# Patient Record
Sex: Female | Born: 1985 | Race: Black or African American | Hispanic: No | Marital: Single | Smoking: Current every day smoker
Health system: Southern US, Community
[De-identification: ages and names within clinical notes are randomized; demographics above are authoritative.]

## PROBLEM LIST (undated history)

## (undated) DIAGNOSIS — K089 Disorder of teeth and supporting structures, unspecified: Secondary | ICD-10-CM

---
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
## 2004-01-03 ENCOUNTER — Other Ambulatory Visit: Admission: RE | Admit: 2004-01-03 | Discharge: 2004-01-03 | Payer: Self-pay | Admitting: Obstetrics and Gynecology

## 2004-06-15 ENCOUNTER — Ambulatory Visit: Payer: Self-pay | Admitting: Internal Medicine

## 2004-06-21 ENCOUNTER — Ambulatory Visit: Payer: Self-pay | Admitting: *Deleted

## 2005-03-16 ENCOUNTER — Inpatient Hospital Stay (HOSPITAL_COMMUNITY): Admission: AD | Admit: 2005-03-16 | Discharge: 2005-03-16 | Payer: Self-pay | Admitting: Obstetrics and Gynecology

## 2005-07-24 ENCOUNTER — Ambulatory Visit: Payer: Self-pay | Admitting: Family Medicine

## 2006-09-18 ENCOUNTER — Ambulatory Visit: Payer: Self-pay | Admitting: Obstetrics and Gynecology

## 2006-09-18 ENCOUNTER — Inpatient Hospital Stay (HOSPITAL_COMMUNITY): Admission: AD | Admit: 2006-09-18 | Discharge: 2006-09-22 | Payer: Self-pay | Admitting: Obstetrics and Gynecology

## 2007-06-14 ENCOUNTER — Emergency Department (HOSPITAL_COMMUNITY): Admission: EM | Admit: 2007-06-14 | Discharge: 2007-06-14 | Payer: Self-pay | Admitting: Family Medicine

## 2007-08-12 ENCOUNTER — Emergency Department (HOSPITAL_COMMUNITY): Admission: EM | Admit: 2007-08-12 | Discharge: 2007-08-13 | Payer: Self-pay | Admitting: Emergency Medicine

## 2008-01-23 ENCOUNTER — Inpatient Hospital Stay (HOSPITAL_COMMUNITY): Admission: AD | Admit: 2008-01-23 | Discharge: 2008-01-23 | Payer: Self-pay | Admitting: Obstetrics & Gynecology

## 2008-03-24 ENCOUNTER — Ambulatory Visit (HOSPITAL_COMMUNITY): Admission: RE | Admit: 2008-03-24 | Discharge: 2008-03-24 | Payer: Self-pay | Admitting: Obstetrics & Gynecology

## 2008-07-31 ENCOUNTER — Inpatient Hospital Stay (HOSPITAL_COMMUNITY): Admission: AD | Admit: 2008-07-31 | Discharge: 2008-08-04 | Payer: Self-pay | Admitting: Obstetrics & Gynecology

## 2008-08-01 ENCOUNTER — Encounter: Payer: Self-pay | Admitting: Obstetrics & Gynecology

## 2009-11-10 ENCOUNTER — Emergency Department (HOSPITAL_COMMUNITY): Admission: EM | Admit: 2009-11-10 | Discharge: 2009-11-10 | Payer: Self-pay | Admitting: Family Medicine

## 2010-05-26 ENCOUNTER — Emergency Department (HOSPITAL_COMMUNITY)
Admission: EM | Admit: 2010-05-26 | Discharge: 2010-05-26 | Disposition: A | Discharge status: Home / Self Care | Payer: No Typology Code available for payment source | Attending: Emergency Medicine | Admitting: Emergency Medicine

## 2010-05-26 ENCOUNTER — Emergency Department (HOSPITAL_COMMUNITY): Payer: No Typology Code available for payment source

## 2010-05-26 DIAGNOSIS — R10811 Right upper quadrant abdominal tenderness: Secondary | ICD-10-CM | POA: Insufficient documentation

## 2010-05-26 DIAGNOSIS — R1011 Right upper quadrant pain: Secondary | ICD-10-CM | POA: Insufficient documentation

## 2010-05-26 DIAGNOSIS — S139XXA Sprain of joints and ligaments of unspecified parts of neck, initial encounter: Secondary | ICD-10-CM | POA: Insufficient documentation

## 2010-05-26 DIAGNOSIS — M25519 Pain in unspecified shoulder: Secondary | ICD-10-CM | POA: Insufficient documentation

## 2010-05-26 DIAGNOSIS — R079 Chest pain, unspecified: Secondary | ICD-10-CM | POA: Insufficient documentation

## 2010-05-26 DIAGNOSIS — M549 Dorsalgia, unspecified: Secondary | ICD-10-CM | POA: Insufficient documentation

## 2010-05-26 DIAGNOSIS — Y929 Unspecified place or not applicable: Secondary | ICD-10-CM | POA: Insufficient documentation

## 2010-05-26 DIAGNOSIS — M542 Cervicalgia: Secondary | ICD-10-CM | POA: Insufficient documentation

## 2010-05-26 LAB — URINALYSIS, ROUTINE W REFLEX MICROSCOPIC
Ketones, ur: NEGATIVE mg/dL
Nitrite: NEGATIVE
Protein, ur: NEGATIVE mg/dL
Urobilinogen, UA: 0.2 mg/dL (ref 0.0–1.0)

## 2010-05-26 LAB — POCT PREGNANCY, URINE: Preg Test, Ur: NEGATIVE

## 2010-05-26 MED ORDER — IOHEXOL 300 MG/ML  SOLN
100.0000 mL | Freq: Once | INTRAMUSCULAR | Status: AC | PRN
  Administered 2010-05-26: 100 mL via INTRAVENOUS

## 2010-07-19 LAB — CBC
Platelets: 437 10*3/uL — ABNORMAL HIGH (ref 150–400)
RBC: 3.37 MIL/uL — ABNORMAL LOW (ref 3.87–5.11)
RDW: 14.7 % (ref 11.5–15.5)
WBC: 15.6 10*3/uL — ABNORMAL HIGH (ref 4.0–10.5)

## 2010-08-22 NOTE — Op Note (Signed)
NAMEAVERIE, HORNBAKER                 ACCOUNT NO.:  [id number]   MEDICAL RECORD NO.:  [id number]          PATIENT TYPE:  INP   LOCATION:  9171                          FACILITY:  [facility]   PHYSICIAN:  Roseanna Rainbow, M.D.DATE OF BIRTH:  [birth year]   DATE OF PROCEDURE:  08/01/2008  DATE OF DISCHARGE:                               OPERATIVE REPORT   PREOPERATIVE DIAGNOSES:  1. Intrauterine pregnancy at term.  2. History of a previous cesarean delivery for trial of labor.  3. Protracted latent phase of labor.   POSTOPERATIVE DIAGNOSES:  1. Intrauterine pregnancy at term.  2. History of a previous cesarean delivery for trial of labor.  3. Protracted latent phase of labor.   PROCEDURE:  Repeat cesarean delivery, lysis of adhesions.   SURGEON:  Roseanna Rainbow, MD   ANESTHESIA:  Epidural.   FINDINGS:  Adhesions involving the bladder flap, anterior cul-de-sac.  Meconium stained fluid.   PATHOLOGY:  Placenta.   ESTIMATED BLOOD LOSS:  700 mL.   COMPLICATIONS:  None.   DESCRIPTION OF PROCEDURE:  The patient was taken to the operating room  with an IV running and an epidural catheter in situ.  A Foley catheter  was then placed as well.  The patient was placed in the dorsal supine  position with a leftward tilt and prepped and draped in the usual  sterile fashion.  After a time-out had been completed, the previous  transverse scar was incised with the scalpel.  This incision was carried  down to the fascia.  The fascia was incised along the length of the  incision.  The superior aspect of the fascial incision was tented up and  the underlying rectus muscles was dissected off.  The inferior aspect of  the fascial incision was manipulated in a similar fashion.  There were  omental adhesions to the parietal peritoneum of the anterior abdominal  wall.  There were dense adhesions involving the parietal peritoneum of  the anterior abdominal wall to the peritoneum of the bladder  flap.  These adhesions were divided using both sharp and blunt dissection.  The  bladder blade was then placed.  The lower uterine segment was then  incised in a transverse fashion using the scalpel.  The incision was  then extended bluntly.  The infant's head was delivered atraumatically.  The oropharynx was suctioned with a bulb suction.  The cord was clamped  and cut.  The infant was handed off to the awaiting neonatologist.  An  umbilical artery pH was sent.  The placenta was then removed.  The  intrauterine cavity was evacuated of any remaining amniotic fluid clots  and debris with moistened laparotomy sponge.  The uterine incision was  then reapproximated in a running interlocking fashion using a suture of  0 Monocryl.  A second imbricating layer of the same suture was placed.  Adequate hemostasis was noted.  The paracolic gutters were then  irrigated.  Seprafilm was placed over the uterine incision.  The  parietal peritoneum was then reapproximated in a running fashion using 2-  0 Vicryl.  The Seprafilm was placed under the upper aspect of the  closure.  The fascia was then closed with 2 running sutures of 0 PDS  tied in the midline.  The skin was closed in a subcuticular fashion  using a suture of 3-0 Monocryl.  At the close of the procedure, the  instrument and pack counts were said to be correct x2.  The patient was  taken to the PACU awake and in stable condition.      Roseanna Rainbow, M.D.  Electronically Signed     LAJ/MEDQ  D:  08/01/2008  T:  08/01/2008  Job:  [id number]

## 2010-08-22 NOTE — Discharge Summary (Signed)
[name], [name]                 ACCOUNT NO.:  [id number]   MEDICAL RECORD NO.:  [id number]          PATIENT TYPE:  INP   LOCATION:  9114                          [facility]ILITY:  [facility]   PHYSICIAN:  Charles A. Clearance Coots, M.D.DATE OF BIRTH:  08/25/1985   DATE OF ADMISSION:  07/31/2008  DATE OF DISCHARGE:  08/04/2008                               DISCHARGE SUMMARY   ADMITTING DIAGNOSES:  Term pregnancy, previous cesarean section, early  labor, desires trial of labor.   DISCHARGE DIAGNOSES:  Term pregnancy, previous cesarean section, early  labor, desires trial of labor.  Status post repeat low transverse  cesarean section and lysis of adhesions on August 01, 2008.  A viable  female delivered at 6:21, Apgars 8 at 1 minute and 9 at 5 minutes, weight  of 3625 g, length of 53.34 cm.  Mother and infant discharged home in  good condition.   REASON FOR ADMISSION:  A 25 year old para 1, estimated date of  confinement of July 28, 2008, presents at 40+ weeks' gestation with  uterine contractions and complaint of rupture of membranes.   PAST MEDICAL HISTORY:   SURGERY:  Cesarean section.   ILLNESSES:  None.   MEDICATIONS:  Prenatal vitamins.   ALLERGIES:  PENICILLIN and IBUPROFEN.   SOCIAL HISTORY:  Single.  Positive tobacco, positive alcohol.  Negative  recreational drug use.   FAMILY HISTORY:  No major illnesses known.   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   PHYSICAL EXAMINATION:  GENERAL:  A well-nourished, well-developed female  in no acute distress.  Afebrile.  VITAL SIGNS:  Stable.  LUNGS:  Clear to auscultation bilaterally.  HEART:  Regular rate and rhythm.  ABDOMEN:  Gravid, nontender.  PELVIC:  Sterile vaginal exam was done per registered nurse and  confirmed gross rupture of membranes.  Cervix was 2 cm dilated, 60%  effaced and vertex at a -2 station.   ADMITTING LABORATORY FINDINGS:   Hemoglobin 10, hematocrit 31, white  blood cell count 12,600, and platelets 437,000.  RPR is nonreactive.   HOSPITAL COURSE:  The patient was admitted and progressed to 5-cm  dilatation, no further progress.  Decision was made to proceed with  cesarean section delivery for protracted latent phase of labor.  Repeat  low transverse cesarean section and lysis of adhesions were performed on  August 01, 2008.  There were no intraoperative complications.  Postoperative course was uncomplicated.  The patient was discharged home  on postop day #3 in good condition.   DISCHARGE LABORATORY FINDINGS:  Hemoglobin 9.5, hematocrit 28, white  blood cell count of 15,600, and platelets 374,000.   DISCHARGE DISPOSITION:   MEDICATIONS:  Percocet was prescribed for pain.  Continue prenatal  vitamins.   Routine written instructions were given for discharge after cesarean  section.  The patient is to call office for followup appointment in 2  weeks.       Charles A. Clearance Coots, M.D.  Electronically Signed     CAH/MEDQ  D:  08/04/2008  T:  08/04/2008  Job:  [id number]

## 2010-08-22 NOTE — H&P (Signed)
[name], [name]                 ACCOUNT NO.:  [id number]   MEDICAL RECORD NO.:  [id number]          PATIENT TYPE:  INP   LOCATION:  9171                          [facility]ILITY[facility]  [facility]   PHYSICIAN:  Roseanna Rainbow, M.D.DATE OF BIRTH:  1986-04-01   DATE OF ADMISSION:  07/31/2008  DATE OF DISCHARGE:                              HISTORY & PHYSICAL   CHIEF COMPLAINT:  The patient is a 25 year old para 1 with an estimated  date of confinement of April 21 with an intrauterine pregnancy at 40+  weeks and a history of previous cesarean delivery for a trial of labor  complaining of rupture of membranes and mild uterine contractions.   HISTORY OF PRESENT ILLNESS:  Please see the above.  She reports clear  fluid.   ALLERGIES:  PENICILLIN and IBUPROFEN.   MEDICATIONS:  Prenatal vitamins.   OB RISK FACTORS:  Previous cesarean delivery, GBS asymptomatic  bacteriuria.   PRENATAL LABS:  Blood type is O positive.  Antibody screen negative.  Chlamydia probe negative.  Urine culture and sensitivity in February 15, 2008, insignificant growth.  September 2009, GBS 65,000 colonies/mL.  Quad screen negative.  Pap smear negative.  GC probe negative.  One-hour  GCT 94.  CF testing was negative for mutations.  Platelets 322,000.  RPR  nonreactive.  Rubella immune.  Sickle cell negative.   PAST OBSTETRICAL HISTORY:  In June 2008, she was delivered of a live  born female, 6 pounds 10 ounces, full term via cesarean delivery.   PAST GYN HISTORY:  Noncontributory.   PAST MEDICAL HISTORY:  No significant history of medical diseases.   PAST SURGICAL HISTORY:  Please see the above, oral surgery.   SOCIAL HISTORY:  She is a homemaker, single, not currently using  alcohol, formally a moderate user.  Previously smoked one-half pack per  day.  Denies illicit drug use.   FAMILY HISTORY:  No major illnesses known.   REVIEW OF SYSTEMS:  GU:  Please see the above.   PHYSICAL EXAMINATION:  Vital signs are  stable.  Afebrile.  Fetal heart  tracing reassuring.  Tocodynamometer irregular uterine contractions.  Sterile vaginal exam per the RN, grossly ruptured.   ASSESSMENT:  Primipara at term, history of a previous cesarean delivery,  desires a trial of labor, history of a positive group B streptococcus.   PLAN:  Admission, GBS prophylaxis, expectant management for now possible  augmentation of labor with low-dose Pitocin if needed.      Roseanna Rainbow, M.D.  Electronically Signed     LAJ/MEDQ  D:  07/31/2008  T:  07/31/2008  Job:  [id number]

## 2010-08-22 NOTE — Op Note (Signed)
[name], [name]                 ACCOUNT NO.:  [id number]   MEDICAL RECORD NO.:  [id number]          PATIENT TYPE:  INP   LOCATION:  9144                          [facility]ACILITY:  [facility]   PHYSICIAN:  Allie Bossier, MD        DATE OF BIRTH:  04/21/1985   DATE OF PROCEDURE:  09/19/2006  DATE OF DISCHARGE:                               OPERATIVE REPORT   PREOPERATIVE DIAGNOSIS:  1. Term intrauterine pregnancy.  2. Premature rupture of membranes.  3. Failure to progress.   POSTOPERATIVE DIAGNOSIS:  1. Term intrauterine pregnancy.  2. Premature rupture of membranes.  3. Failure to progress.  4. Presentation occipitoposterior.   SURGERY:  Primary low transverse cesarean section via Pfannenstiel skin  incision.   SURGEON:  Nicholaus Bloom, M.D.   ASSISTANT:  Carolanne Grumbling, M.D.   ANESTHESIA:  Epidural and local with Marcaine   ESTIMATED BLOOD LOSS:  600 mL   IV FLUIDS:  1500 mL lactated Ringer's.  Urine output clear.   SPECIMENS:  Placenta to labor and delivery.  Cord blood to lab.   INDICATIONS:  25 year old G1 at term presented with PROM, progressed to  6 cm was adequate based on MVU for 6 hours, then failed to progress.  The patient was counseled on risks and benefits and wanted to proceed  with cesarean section.   FINDINGS:  Viable female in the occiput posterior presentation, Apgars  08/09, weight 6 pounds 10 ounces and normal uterus, tubes and ovaries.   PROCEDURE:  The patient was taken to the operating room where epidural  anesthesia was found to be adequate.  She was prepped in the usual  sterile fashion in dorsal supine position with a leftward tilt.  Pfannenstiel skin incision was made and carried to the fascia which was  nicked in midline and extended laterally with Mayo scissors.  Superior  aspect of the fascial incision was grasped Kocher clamps, tented up and  dissected off the rectus muscles using the Mayo scissors.  An inferior  aspect of the fascial incision was  grasped in a similar fashion with  Kocher clamps, tented up and dissected off and the rectus muscles were  dissected off.  Rectus muscles were separated in the midline, abdominal  cavity was entered.  Bladder blade was inserted.  Uterus was incised in  a low transverse fashion.  Baby was noted to be in occiput posterior  presentation and head was delivered atraumatically followed by the body.  Cord was clamped and cut.  Infant was handed to waiting neonatologist.  Placenta was delivered manually.  Cord blood was sent to lab.  The  uterus was cleared of all clots and debris.  Incision was closed with 0  Vicryl in a running fashion running locked fashion.  Excellent  hemostasis was obtained.  Fascia was closed with 0 Vicryl in a running  locked fashion.  Skin was closed with staples.   The patient tolerated procedure well.  There were no complications.  The  patient was taken to the recovery room in stable condition.  Sponge, lap  and  needle counts were correct x2.     ______________________________  Marc Morgans. Mayford Knife, M.D.      Allie Bossier, MD  Electronically Signed    TLW/MEDQ  D:  09/19/2006  T:  09/20/2006  Job:  [id number]

## 2010-08-22 NOTE — Discharge Summary (Signed)
NAMEDEMETRIC, [name]                 ACCOUNT NO.:  [id number]   MEDICAL RECORD NO.:  [id number]          PATIENT TYPE:  INP   LOCATION:  9144                          [facility]   PHYSICIAN:  Tracy L. Mayford Knife, M.D.DATE OF BIRTH:  08/09/1985   DATE OF ADMISSION:  09/18/2006  DATE OF DISCHARGE:  09/22/2006                               DISCHARGE SUMMARY   ADMISSION DIAGNOSES:  1. 38-week 4-day intrauterine pregnancy with premature rupture of      membranes.  2. Group B strep-positive.   DISCHARGE DIAGNOSES:  1. 38 weeks 4 days intrauterine pregnancy, delivered via primary      cesarean section for premature rupture of membranes and failure to      progress.  2. Group B strep treated with Ancef.  3. Anemia secondary to acute blood loss.   LABORATORY DATA:  Hemoglobin 12.6, discharge hemoglobin 10.6.   HOSPITAL COURSE:  The patient is a 25 year old G1, P0 at 91 and 4 weeks  dated by a last menstrual period of December 20, 2005 that gives her an  [facility] of September 21, 2006.  The patient presented with spontaneous rupture of  membranes at 1945 on September 18, 2006.  The patient was initially closed,  thick, and high.  She was group B strep-positive.  She was admitted, and  Pitocin augmentation was started.  She did have an IUPC placed during  her hospitalization.  Adequate labor was documented for 6 hours.  The  patient never progressed past 6 cm.  Decision was made to proceed with  primary cesarean section.  The patient was counseled on the risks and  benefits and agreed.  Please see operative note for full details.   At the time of delivery, a viable female infant was found to be occiput  posterior.  Apgars were 9 and 9.  Weight was 6 pounds 10 ounces.  She  had normal tubes and ovaries.   The patient's postoperative course was unremarkable.  She is voiding,  ambulating, and tolerating p.o. without difficulty.  Son also had an  unremarkable hospital course and was served on the day of  life #2.   DISPOSITION:  Home.   FOLLOW UP:  Follow up in 6 weeks at [facility].   DISCHARGE MEDICATIONS:  1. Motrin 600 mg 1 tablet p.o. q.6h.  2. Percocet 5/325 one to two p.o. q.4-6h. p.r.n.  3. Mirena to be placed at 6 weeks.  4. Prenatal vitamin one tablet daily.   ACTIVITY:  Nothing per vagina, no heavy lifting x6 weeks.   DIET:  Regular.   DISCHARGE INSTRUCTIONS:  The patient is return for evaluation if she has  signs or symptoms of infection, including temperature greater than  100.5, uterine tenderness, or foul-smelling vaginal discharge.           ______________________________  Marc Morgans. Mayford Knife, M.D.     TLW/MEDQ  D:  09/21/2006  T:  09/22/2006  Job:  [id number]

## 2011-01-01 LAB — URINE CULTURE: Colony Count: 35000

## 2011-01-01 LAB — POCT URINALYSIS DIP (DEVICE)
Bilirubin Urine: NEGATIVE
Glucose, UA: NEGATIVE
Ketones, ur: NEGATIVE
Nitrite: NEGATIVE
Operator id: 247071
Protein, ur: 30 — AB
Specific Gravity, Urine: 1.01
Urobilinogen, UA: 0.2
pH: 6

## 2011-01-01 LAB — POCT PREGNANCY, URINE
Operator id: 247071
Preg Test, Ur: NEGATIVE

## 2011-01-08 LAB — COMPREHENSIVE METABOLIC PANEL
ALT: 19
AST: 18
Albumin: 3.2 — ABNORMAL LOW
Alkaline Phosphatase: 84
BUN: 4 — ABNORMAL LOW
Chloride: 104
Potassium: 3.3 — ABNORMAL LOW
Sodium: 133 — ABNORMAL LOW
Total Bilirubin: 0.1 — ABNORMAL LOW
Total Protein: 6.9

## 2011-01-08 LAB — POCT PREGNANCY, URINE: Preg Test, Ur: POSITIVE

## 2011-01-08 LAB — URINALYSIS, ROUTINE W REFLEX MICROSCOPIC
Glucose, UA: NEGATIVE
Hgb urine dipstick: NEGATIVE
Protein, ur: NEGATIVE
pH: 6.5

## 2011-01-25 LAB — CBC
HCT: 31 — ABNORMAL LOW
Hemoglobin: 10.6 — ABNORMAL LOW
MCHC: 33.4
MCHC: 34.2
MCV: 90.9
Platelets: 273
Platelets: 318
RDW: 14.6 — ABNORMAL HIGH
RDW: 14.8 — ABNORMAL HIGH

## 2011-01-25 LAB — RPR: RPR Ser Ql: NONREACTIVE

## 2011-01-25 LAB — RAPID URINE DRUG SCREEN, HOSP PERFORMED
Amphetamines: NOT DETECTED
Barbiturates: NOT DETECTED
Cocaine: NOT DETECTED
Opiates: NOT DETECTED
Tetrahydrocannabinol: POSITIVE — AB

## 2011-01-25 LAB — DIFFERENTIAL
Basophils Absolute: 0
Basophils Relative: 0
Eosinophils Absolute: 0.1
Neutro Abs: 6.4
Neutrophils Relative %: 68

## 2011-01-25 LAB — RAPID HIV SCREEN (WH-MAU): Rapid HIV Screen: NONREACTIVE

## 2011-01-25 LAB — TYPE AND SCREEN: Antibody Screen: NEGATIVE

## 2011-09-14 ENCOUNTER — Encounter (HOSPITAL_COMMUNITY): Payer: Self-pay

## 2011-09-14 ENCOUNTER — Emergency Department (INDEPENDENT_AMBULATORY_CARE_PROVIDER_SITE_OTHER): Payer: Self-pay

## 2011-09-14 ENCOUNTER — Emergency Department (INDEPENDENT_AMBULATORY_CARE_PROVIDER_SITE_OTHER)
Admission: EM | Admit: 2011-09-14 | Discharge: 2011-09-14 | Disposition: A | Discharge status: Home / Self Care | Payer: Self-pay | Source: Home / Self Care

## 2011-09-14 DIAGNOSIS — S239XXA Sprain of unspecified parts of thorax, initial encounter: Secondary | ICD-10-CM

## 2011-09-14 DIAGNOSIS — S29012A Strain of muscle and tendon of back wall of thorax, initial encounter: Secondary | ICD-10-CM

## 2011-09-14 MED ORDER — CYCLOBENZAPRINE HCL 5 MG PO TABS: 5.0000 mg | ORAL_TABLET | Freq: Three times a day (TID) | ORAL | Status: AC | PRN

## 2011-09-14 MED ORDER — NAPROXEN 500 MG PO TABS: 500.0000 mg | ORAL_TABLET | Freq: Two times a day (BID) | ORAL | Status: AC
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
## 2011-09-14 NOTE — Discharge Instructions (Signed)
Try making warm compresses with warm water and epsom salt at use on your back and shoulders at least 2 times a day.    Muscle Strain A muscle strain, or pulled muscle, occurs when a muscle is over-stretched. A small number of muscle fibers may also be torn. This is especially common in athletes. This happens when a sudden violent force placed on a muscle pushes it past its capacity. Usually, recovery from a pulled muscle takes 1 to 2 weeks. But complete healing will take 5 to 6 weeks. There are millions of muscle fibers. Following injury, your body will usually return to normal quickly. HOME CARE INSTRUCTIONS   While awake, apply ice to the sore muscle for 15 to 20 minutes each hour for the first 2 days. Put ice in a plastic bag and place a towel between the bag of ice and your skin.   Do not use the pulled muscle for several days. Do not use the muscle if you have pain.   You may wrap the injured area with an elastic bandage for comfort. Be careful not to bind it too tightly. This may interfere with blood circulation.   Only take over-the-counter or prescription medicines for pain, discomfort, or fever as directed by your caregiver. Do not use aspirin as this will increase bleeding (bruising) at injury site.   Warming up before exercise helps prevent muscle strains.  SEEK MEDICAL CARE IF:  There is increased pain or swelling in the affected area. MAKE SURE YOU:   Understand these instructions.   Will watch your condition.   Will get help right away if you are not doing well or get worse.  Document Released: 03/26/2005 Document Revised: 03/15/2011 Document Reviewed: 10/23/2006 [facility] Patient Information 2012 [place], [place].
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
## 2011-09-16 NOTE — ED Provider Notes (Signed)
Medical screening examination/treatment/procedure(s) were performed by non-physician practitioner and as supervising physician I was immediately available for consultation/collaboration.  Nannette Zill M. MD   Arlana Canizales M Thaily Hackworth, MD 09/16/11 2013 

## 2013-02-26 ENCOUNTER — Emergency Department (HOSPITAL_COMMUNITY)
Admission: EM | Admit: 2013-02-26 | Discharge: 2013-02-27 | Disposition: A | Discharge status: Home / Self Care | Payer: Self-pay | Attending: Emergency Medicine | Admitting: Emergency Medicine

## 2013-02-26 ENCOUNTER — Encounter (HOSPITAL_COMMUNITY): Payer: Self-pay | Admitting: Emergency Medicine

## 2013-02-26 DIAGNOSIS — K089 Disorder of teeth and supporting structures, unspecified: Secondary | ICD-10-CM | POA: Insufficient documentation

## 2013-02-26 DIAGNOSIS — F17200 Nicotine dependence, unspecified, uncomplicated: Secondary | ICD-10-CM | POA: Insufficient documentation

## 2013-02-26 DIAGNOSIS — R21 Rash and other nonspecific skin eruption: Secondary | ICD-10-CM | POA: Insufficient documentation

## 2013-02-26 DIAGNOSIS — K0889 Other specified disorders of teeth and supporting structures: Secondary | ICD-10-CM

## 2013-02-26 DIAGNOSIS — Z880 Allergy status to penicillin: Secondary | ICD-10-CM | POA: Insufficient documentation

## 2013-02-26 MED ORDER — DEXAMETHASONE SODIUM PHOSPHATE 10 MG/ML IJ SOLN
10.0000 mg | Freq: Once | INTRAMUSCULAR | Status: AC
  Administered 2013-02-26: 10 mg via INTRAMUSCULAR

## 2013-02-26 MED ORDER — DEXAMETHASONE SODIUM PHOSPHATE 10 MG/ML IJ SOLN
INTRAMUSCULAR | Status: AC
  Filled 2013-02-26: qty 1

## 2013-02-26 MED ORDER — HYDROCODONE-ACETAMINOPHEN 5-325 MG PO TABS
2.0000 | ORAL_TABLET | Freq: Once | ORAL | Status: AC
  Administered 2013-02-26: 2 via ORAL
  Filled 2013-02-26: qty 2

## 2013-02-26 NOTE — ED Notes (Signed)
Pt. reports right lower molar pain with swelling for several days unrelieved by OTC pain medications and generalized itchy rashes for 2 days unrelieved by Benadryl.

## 2013-02-26 NOTE — ED Provider Notes (Signed)
CSN: [id number]     Arrival date & time 02/26/13  2110 History  This chart was scribed for non-physician practitioner Arthor Captain, PA, working with Lyanne Co, MD by Ronal Fear, ED scribe. This patient was seen in room TR06C/TR06C and the patient's care was started at 11:36 PM.    Chief Complaint  Patient presents with  . Dental Pain  . Rash   (Consider location/radiation/quality/duration/timing/severity/associated sxs/prior Treatment) HPI  HPI Comments: [name] is a 27 y.o. female who presents to the Emergency Department complaining of sudden onset fractured tooth onset 4x days ago to her right lower second molar and is radiating down to the right side of her neck. Pt took a goodie powder that resulted in lip and eye swelling.  Pt then took 2 benadryl which helped to relieve the lip and eye swelling. Pt also complains of hives to her back, legs, and stomach onset a few days ago. Pt thought that the hives were due to a dog allergy and benadryl helped to relieve these symptoms.  Pt denies, fever, chills, cold symptoms, nausea, vomiting, SOB, CP and inner neck swelling. Pt is a smoker.    History reviewed. No pertinent past medical history. Past Surgical History  Procedure Laterality Date  . Cesarean section     No family history on file. History  Substance Use Topics  . Smoking status: Current Every Day Smoker  . Smokeless tobacco: Not on file  . Alcohol Use: Yes   OB History   Grav Para Term Preterm Abortions TAB SAB Ect Mult Living                 Review of Systems  Constitutional: Negative for fever and chills.  HENT: Negative for congestion, rhinorrhea and sore throat.   Respiratory: Negative for cough and shortness of breath.   Cardiovascular: Negative for chest pain.  Gastrointestinal: Negative for nausea and vomiting.  Musculoskeletal: Negative for neck pain.  All other systems reviewed and are negative.    Allergies  Amoxicillin and  Penicillins  Home Medications  No current outpatient prescriptions on file. BP 117/44  Pulse 86  Temp(Src) 98.8 F (37.1 C) (Oral)  Resp 18  SpO2 99% Physical Exam  Nursing note and vitals reviewed. Constitutional: She is oriented to person, place, and time. She appears well-developed and well-nourished.  HENT:  Head: Normocephalic and atraumatic.  Mouth/Throat: No posterior oropharyngeal edema or tonsillar abscesses.  Tender to palpation at the right lower second molar and right jaw. Tender to palpation to right sternocleidomastoid.  Eyes: Pupils are equal, round, and reactive to light.  Neck: Neck supple.  Cardiovascular: Normal rate, regular rhythm and normal heart sounds.   No murmur heard. Pulmonary/Chest: Effort normal and breath sounds normal. No respiratory distress. She has no wheezes. She has no rales.  Abdominal: Soft. Bowel sounds are normal.  Neurological: She is alert and oriented to person, place, and time.  Skin: Skin is warm and dry. No rash noted.  Psychiatric: She has a normal mood and affect.    ED Course  Procedures (including critical care time)  DIAGNOSTIC STUDIES: Oxygen Saturation is 99% on RA, normal by my interpretation.    COORDINATION OF CARE: 11:43 PM- Pt advised of plan for treatment including a dental block and medication for pain and pt agrees.    Labs Review Labs Reviewed - No data to display Imaging Review No results found.  EKG Interpretation   None     Dental Performed  by: Sebasthian Stailey Authorized by: Arthor Captain Consent: Verbal consent obtained. Patient understanding: patient states understanding of the procedure being performed Patient identity confirmed: verbally with patient Local anesthesia used: yes Local anesthetic: bupivacaine 0.5% with epinephrine Anesthetic total: 0.4 ml Patient sedated: no Patient tolerance: Patient tolerated the procedure well with no immediate complications.     MDM   1. Pain,  dental    No signs of allergic rxn or angioedema at this time. Breathing well. No wheezing, voice change etc. Will give decadron injection.  Pain relieved with dental block. Advised to avoid asa containing products Patient with toothache.  No gross abscess.  Exam unconcerning for Ludwig's angina or spread of infection.  Will treat with penicillin and pain medicine.  Urged patient to follow-up with dentist.     I personally performed the services described in this documentation, which was scribed in my presence. The recorded information has been reviewed and is accurate.     Arthor Captain, PA-C 02/28/13 2128

## 2013-02-27 MED ORDER — OXYCODONE-ACETAMINOPHEN 5-325 MG PO TABS: 1.0000 | ORAL_TABLET | ORAL | Status: DC | PRN

## 2013-02-28 ENCOUNTER — Emergency Department (HOSPITAL_COMMUNITY): Payer: Self-pay

## 2013-02-28 ENCOUNTER — Encounter (HOSPITAL_COMMUNITY): Payer: Self-pay | Admitting: Emergency Medicine

## 2013-02-28 ENCOUNTER — Observation Stay (HOSPITAL_COMMUNITY)
Admission: EM | Admit: 2013-02-28 | Discharge: 2013-03-01 | Disposition: A | Discharge status: Home / Self Care | Payer: Self-pay | Attending: Internal Medicine | Admitting: Internal Medicine

## 2013-02-28 DIAGNOSIS — T50995A Adverse effect of other drugs, medicaments and biological substances, initial encounter: Secondary | ICD-10-CM

## 2013-02-28 DIAGNOSIS — T7840XA Allergy, unspecified, initial encounter: Principal | ICD-10-CM | POA: Insufficient documentation

## 2013-02-28 DIAGNOSIS — F17200 Nicotine dependence, unspecified, uncomplicated: Secondary | ICD-10-CM | POA: Insufficient documentation

## 2013-02-28 DIAGNOSIS — R0609 Other forms of dyspnea: Secondary | ICD-10-CM | POA: Insufficient documentation

## 2013-02-28 DIAGNOSIS — R0989 Other specified symptoms and signs involving the circulatory and respiratory systems: Secondary | ICD-10-CM | POA: Insufficient documentation

## 2013-02-28 DIAGNOSIS — R220 Localized swelling, mass and lump, head: Secondary | ICD-10-CM | POA: Insufficient documentation

## 2013-02-28 DIAGNOSIS — R0602 Shortness of breath: Secondary | ICD-10-CM | POA: Insufficient documentation

## 2013-02-28 DIAGNOSIS — I10 Essential (primary) hypertension: Secondary | ICD-10-CM | POA: Insufficient documentation

## 2013-02-28 DIAGNOSIS — E876 Hypokalemia: Secondary | ICD-10-CM

## 2013-02-28 DIAGNOSIS — K122 Cellulitis and abscess of mouth: Secondary | ICD-10-CM | POA: Diagnosis present

## 2013-02-28 DIAGNOSIS — Z888 Allergy status to other drugs, medicaments and biological substances status: Secondary | ICD-10-CM | POA: Insufficient documentation

## 2013-02-28 DIAGNOSIS — R221 Localized swelling, mass and lump, neck: Secondary | ICD-10-CM

## 2013-02-28 HISTORY — DX: Disorder of teeth and supporting structures, unspecified: K08.9

## 2013-02-28 LAB — CBC WITH DIFFERENTIAL/PLATELET
Basophils Relative: 0 % (ref 0–1)
Eosinophils Absolute: 0 10*3/uL (ref 0.0–0.7)
Eosinophils Relative: 0 % (ref 0–5)
HCT: 46 % (ref 36.0–46.0)
Hemoglobin: 15.7 g/dL — ABNORMAL HIGH (ref 12.0–15.0)
MCH: 32 pg (ref 26.0–34.0)
MCHC: 34.1 g/dL (ref 30.0–36.0)
Monocytes Absolute: 0.6 10*3/uL (ref 0.1–1.0)
Monocytes Relative: 7 % (ref 3–12)

## 2013-02-28 LAB — COMPREHENSIVE METABOLIC PANEL
Albumin: 3.3 g/dL — ABNORMAL LOW (ref 3.5–5.2)
BUN: 10 mg/dL (ref 6–23)
Creatinine, Ser: 0.75 mg/dL (ref 0.50–1.10)
Total Bilirubin: 0.3 mg/dL (ref 0.3–1.2)
Total Protein: 6.5 g/dL (ref 6.0–8.3)

## 2013-02-28 LAB — CG4 I-STAT (LACTIC ACID): Lactic Acid, Venous: 1.3 mmol/L (ref 0.5–2.2)

## 2013-02-28 LAB — MAGNESIUM: Magnesium: 1.7 mg/dL (ref 1.5–2.5)

## 2013-02-28 MED ORDER — FAMOTIDINE IN NACL 20-0.9 MG/50ML-% IV SOLN
20.0000 mg | Freq: Two times a day (BID) | INTRAVENOUS | Status: DC
  Administered 2013-02-28 – 2013-03-01 (×2): 20 mg via INTRAVENOUS
  Filled 2013-02-28 (×3): qty 50

## 2013-02-28 MED ORDER — DIPHENHYDRAMINE HCL 50 MG/ML IJ SOLN
25.0000 mg | Freq: Once | INTRAMUSCULAR | Status: AC
  Administered 2013-02-28: 25 mg via INTRAVENOUS
  Filled 2013-02-28: qty 1

## 2013-02-28 MED ORDER — IOHEXOL 300 MG/ML  SOLN
75.0000 mL | Freq: Once | INTRAMUSCULAR | Status: AC | PRN
  Administered 2013-02-28: 75 mL via INTRAVENOUS

## 2013-02-28 MED ORDER — SODIUM CHLORIDE 0.9 % IV SOLN
INTRAVENOUS | Status: DC
  Administered 2013-02-28 – 2013-03-01 (×2): via INTRAVENOUS

## 2013-02-28 MED ORDER — METHYLPREDNISOLONE SODIUM SUCC 125 MG IJ SOLR
60.0000 mg | Freq: Three times a day (TID) | INTRAMUSCULAR | Status: DC
  Administered 2013-02-28 – 2013-03-01 (×3): 60 mg via INTRAVENOUS
  Filled 2013-02-28: qty 0.96
  Filled 2013-02-28: qty 2
  Filled 2013-02-28 (×3): qty 0.96
  Filled 2013-02-28: qty 2
  Filled 2013-02-28: qty 0.96

## 2013-02-28 MED ORDER — SODIUM CHLORIDE 0.9 % IV BOLUS (SEPSIS)
1000.0000 mL | Freq: Once | INTRAVENOUS | Status: AC
  Administered 2013-02-28: 1000 mL via INTRAVENOUS

## 2013-02-28 MED ORDER — DIPHENHYDRAMINE HCL 50 MG/ML IJ SOLN
25.0000 mg | INTRAMUSCULAR | Status: AC
  Administered 2013-02-28 – 2013-03-01 (×3): 25 mg via INTRAVENOUS
  Filled 2013-02-28 (×3): qty 1

## 2013-02-28 MED ORDER — POTASSIUM CHLORIDE 10 MEQ/100ML IV SOLN
10.0000 meq | INTRAVENOUS | Status: AC
  Administered 2013-02-28 (×3): 10 meq via INTRAVENOUS
  Filled 2013-02-28 (×4): qty 100

## 2013-02-28 MED ORDER — CLINDAMYCIN PHOSPHATE 600 MG/50ML IV SOLN
600.0000 mg | Freq: Once | INTRAVENOUS | Status: AC
  Administered 2013-02-28: 600 mg via INTRAVENOUS
  Filled 2013-02-28: qty 50

## 2013-02-28 MED ORDER — METHYLPREDNISOLONE SODIUM SUCC 125 MG IJ SOLR: 125.0000 mg | Freq: Once | INTRAMUSCULAR | Status: DC

## 2013-02-28 MED ORDER — ENOXAPARIN SODIUM 40 MG/0.4ML ~~LOC~~ SOLN
40.0000 mg | SUBCUTANEOUS | Status: DC
  Administered 2013-02-28: 40 mg via SUBCUTANEOUS
  Filled 2013-02-28 (×2): qty 0.4

## 2013-02-28 MED ORDER — FAMOTIDINE IN NACL 20-0.9 MG/50ML-% IV SOLN
20.0000 mg | Freq: Once | INTRAVENOUS | Status: AC
  Administered 2013-02-28: 20 mg via INTRAVENOUS
  Filled 2013-02-28: qty 50

## 2013-02-28 MED ORDER — DEXAMETHASONE SODIUM PHOSPHATE 10 MG/ML IJ SOLN
10.0000 mg | Freq: Once | INTRAMUSCULAR | Status: AC
  Administered 2013-02-28: 10 mg via INTRAVENOUS
  Filled 2013-02-28: qty 1

## 2013-02-28 NOTE — ED Notes (Signed)
Pt undressed, in gown, on continuous pulse oximetry and blood pressure cuff 

## 2013-02-28 NOTE — H&P (Signed)
Date: 02/28/2013               Patient Name:  [name] MRN: [id number]  DOB: [birth year] Age / Sex: 27 y.o., female   PCP: No primary provider on file.         Medical Service: Internal Medicine Teaching Service         Attending Physician: Dr. Josem Kaufmann    First Contact: Dr. Aundria Rud Pager: [phone]  Second Contact: Dr. Shirlee Latch Pager: [phone]       After Hours (After 5p/  First Contact Pager: [phone]  weekends / holidays): Second Contact Pager: [phone]   Chief Complaint: swollen lips and tongue   History of Present Illness:  [name] is a 27 year old woman without significant PMH who presents with swollen tongue, lips and sore throat.    Patient states she started having some swelling and hives on Sunday 11/16 which she attributed to dog being in the house due to cold weather and being on her bed.  She took Benadryl and symptoms improved.  On Wednesday, she developed severe tooth pain and applied Goody powder to affected area which caused lip swelling soon after; she took Benadryl x 2 which improved the swelling.  On Thursday, she woke up with swollen lips and tongue and tooth was still hurting so she came to ED.  In the ED, she was given dexamethasone 10 mg injection including local injections to affected area as well as Norco and was sent home with a prescription for Percocet (no antibiotics).  Tooth pain has since resolved.  She was symptoms-free yesterday 11/21.  However, this morning she woke up with severe lip and tongue swelling and was having difficulty breathing (though no trouble swallowing) so she immediately came to the ED.  Of note, she did take Tylenol through Friday evening.    No history of asthma.  She does have documented allergies to ASA, Naproxen, and Penicillins (all hives and swelling).  She smokes 1-2 cigarettes/day.  No new foods or soaps/detergents. Denies difficulty swallowing, neck swelling, fever/chills, chest pain, SOB, nausea/vomiting.    In ED today, patient  received dexamethasone 10 mg IV, benadryl 25 mg IV, pepcid 20 mg IV, clindamycin 600 mg IV, and NS 1 L bolus (no epinephrine).  Her symptoms have since improved.  CT showed extensive edema involving uvula, large retropharyngeal effusion (extends from C1-7) without evidence of abscess, could be due to pharyngitis or allergic reaction.  There is also edema in larynx especially on right.    Meds: Current Facility-Administered Medications  Medication Dose Route Frequency Provider Last Rate Last Dose  . 0.9 %  sodium chloride infusion   Intravenous Continuous Fayrene Helper, PA-C 125 mL/hr at 02/28/13 1546    . diphenhydrAMINE (BENADRYL) injection 25 mg  25 mg Intravenous Q4H Annett Gula, MD      . potassium chloride 10 mEq in 100 mL IVPB  10 mEq Intravenous Q1 Hr x 3 Annett Gula, MD 100 mL/hr at 02/28/13 1624 10 mEq at 02/28/13 1624  no home meds  Allergies: Allergies as of 02/28/2013 - Review Complete 02/28/2013  Allergen Reaction Noted  . Amoxicillin Hives and Swelling 09/14/2011  . Aspirin Hives and Swelling 02/26/2013  . Naproxen Swelling 02/26/2013  . Penicillins Hives and Swelling 09/14/2011   Past Medical History  Diagnosis Date  . Poor dentition    Past Surgical History  Procedure Laterality Date  . Cesarean section     No family history  on file. History   Social History  . Marital Status: Single    Spouse Name: N/A    Number of Children: N/A  . Years of Education: N/A   Occupational History  . Not on file.   Social History Main Topics  . Smoking status: Current Every Day Smoker  . Smokeless tobacco: Not on file  . Alcohol Use: Yes  . Drug Use: No  . Sexual Activity:    Other Topics Concern  . Not on file   Social History Narrative  . No narrative on file    Review of Systems: Review of Systems  Constitutional: Negative for fever and chills.  HENT: Positive for sore throat.   Eyes: Negative for blurred vision.  Respiratory: Positive for shortness of  breath. Negative for cough and wheezing.   Cardiovascular: Negative for chest pain and leg swelling.  Gastrointestinal: Negative for nausea, vomiting, abdominal pain, diarrhea and constipation.  Genitourinary: Negative for dysuria.  Musculoskeletal: Negative for falls.  Skin: Positive for rash.  Neurological: Negative for dizziness, loss of consciousness, weakness and headaches.    Physical Exam: Blood pressure 113/94, pulse 81, temperature 98.6 F (37 C), temperature source Oral, resp. rate 18, weight 165 lb (74.844 kg), last menstrual period 10/28/2012, SpO2 100.00%. General: alert, cooperative, and in no apparent distress, speaking in full sentences HEENT: pupils equal round and reactive to light, vision grossly intact, oropharynx non-erythematous and without exudates but with significantly edematous uvula Neck: supple, no lymphadenopathy Lungs: clear to ascultation bilaterally, normal work of respiration, no wheezes, rales, ronchi Heart: regular rate and rhythm, no murmurs, gallops, or rubs Abdomen: soft, non-tender, non-distended, normal bowel sounds Extremities: 2+ DP/PT pulses bilaterally, no cyanosis, clubbing, or edema Neurologic: alert & oriented X3, cranial nerves II-XII intact, strength grossly intact, sensation intact to light touch   Lab results: Basic Metabolic Panel:  Recent Labs  16/10/96 1105 02/28/13 1342  NA 140  --   K 3.4*  --   CL 104  --   CO2 26  --   GLUCOSE 82  --   BUN 10  --   CREATININE 0.75  --   CALCIUM 8.7  --   MG  --  1.7   Liver Function Tests:  Recent Labs  02/28/13 1105  AST 24  ALT 19  ALKPHOS 75  BILITOT 0.3  PROT 6.5  ALBUMIN 3.3*   CBC:  Recent Labs  02/28/13 1105  WBC 8.5  NEUTROABS 5.0  HGB 15.7*  HCT 46.0  MCV 93.7  PLT 359   Urine Drug Screen: Drugs of Abuse     Component Value Date/Time   LABOPIA NONE DETECTED 09/19/2006 0220   COCAINSCRNUR NONE DETECTED 09/19/2006 0220   LABBENZ NONE DETECTED 09/19/2006  0220   AMPHETMU NONE DETECTED 09/19/2006 0220   THCU POSITIVE* 09/19/2006 0220   LABBARB  Value: NONE DETECTED        DRUG SCREEN FOR MEDICAL PURPOSES ONLY.  IF CONFIRMATION IS NEEDED FOR ANY PURPOSE, NOTIFY LAB WITHIN 5 DAYS. 09/19/2006 0220    Imaging results:  Ct Soft Tissue Neck W Contrast  02/28/2013   CLINICAL DATA:  Sore throat with Short of breath. Swollen uvula. Question allergic reaction to medication.  EXAM: CT NECK WITH CONTRAST  TECHNIQUE: Multidetector CT imaging of the neck was performed using the standard protocol following the bolus administration of intravenous contrast.  CONTRAST:  75mL OMNIPAQUE IOHEXOL 300 MG/ML  SOLN  COMPARISON:  None.  FINDINGS: There is enlargement  and edema throughout the uvula. There is a large retropharyngeal effusion bilaterally. No rim enhancing abscess is present. There is some edema in the larynx and right piriform sinus which is effaced. The epiglottis is not significantly enlarged. The tongue is normal. Subglottic airway is normal.  Subcentimeter level 2 nodes bilaterally which appear reactive. No mass lesion is identified. Paranasal sinuses are clear.  IMPRESSION: Extensive edema involving the uvula. Large retropharyngeal effusion without evidence of abscess. The effusion extends from C1 through C7. This could be due to pharyngitis and infection however could also be due to an allergic reaction. There is edema in the larynx especially on the right.  I discussed the findings by telephone with Dr.  Lia Hopping   Electronically Signed   By: Marlan Palau M.D.   On: 02/28/2013 12:59    Assessment & Plan by Problem: #Uvulitis- Patient presents with hives/lip and tongue swelling for past week.  Symptoms had improved yesterday, but this morning she awoke with swollen lips and tongue as well as difficulty breathing.  Differential includes allergic vs. Infectious etiologies.  Dexamethasone and benadryl in ED (no epi) improved symptoms.  Patient is afebrile, VSS, no  leucocytosis.  On exam, no oropharyngeal erythema or exudates but significant uvular edema.  CT neck showed extensive edema involving uvula, large retropharyngeal effusion (extends from C1-7) without evidence of abscess, could be due to pharyngitis or allergic reaction; there is also edema in larynx especially on right.  Centor score of 0 (1-2.5% probability of strep pharyngitis).  Therefore, etiology much more likely allergic, possibly due to acetaminophen allergy.  Patient was given dose of IV clindamycin in ED but will not continue antibiotic therapy given low concern for infectious etiology.  Patient will likely need allergy testing as outpatient and will discharge with rx for Epipen that she should carry with her at all time.  -admit to IMTS -Solumedrol 60 mg q8h, will transition to PO steroids tomorrow -Benadryl 25 mg q4h  -Pepcid 20 mg IV q12h  -NS at 125 cc/hr -speech language consult, recommended regular diet with softer foods -BMP in AM  #DVT PPX- lovenox   #Code status- Full code     Dispo: Disposition is deferred at this time, awaiting improvement of current medical problems. Anticipated discharge in approximately 1-2 day(s).   The patient does not have a current PCP (No primary provider on file.) and does need an [facility] hospital follow-up appointment after discharge.   Signed: Rocco Serene, MD 02/28/2013, 5:19 PM

## 2013-02-28 NOTE — ED Notes (Signed)
Per Magda Paganini, unknown number of unsuccessful IV starts-Patient is difficult stick. pt only has #24g on arrival insufficient for contrast administration.  #20g rt ac x 1 attempt in CT by [employer].

## 2013-02-28 NOTE — ED Notes (Signed)
Pt states she was seen Thursday night for tooth pain and had allergic reaction to meds.  Pt presents today because last night she began having increasing SOB and tongue swelling.  Pt states today she woke up and is having difficulty swallowing.  Pt is breathing normally and in NAD at this time.  Pt RA O2 sats are 100%.

## 2013-02-28 NOTE — ED Provider Notes (Signed)
Medical screening examination/treatment/procedure(s) were conducted as a shared visit with non-physician practitioner(s) and myself.  I personally evaluated the patient during the encounter.  Tongue swelling, throat swelling since this morning.  Recently seen in ED for toothache. No distress, hot potato voice, large uvular edema, floor of mouth soft, no trismus, no tongue or lips swelling. Tolerating secretions.  EKG Interpretation    Date/Time:    Ventricular Rate:    PR Interval:    QRS Duration:   QT Interval:    QTC Calculation:   R Axis:     Text Interpretation:                Glynn Octave, MD 02/28/13 1737

## 2013-02-28 NOTE — ED Notes (Signed)
Pt given a warm blanket; family at bedside 

## 2013-02-28 NOTE — ED Notes (Signed)
Pt has returned from being out of the department; pt placed back on continuous pulse oximetry and blood pressure cuff; family at bedside 

## 2013-02-28 NOTE — Evaluation (Signed)
Clinical/Bedside Swallow Evaluation Patient Details  Name: [name] MRN: [id number] Date of Birth: [birth year]  Today's Date: 02/28/2013 Time: 4098-1191 SLP Time Calculation (min): 45 min  Past Medical History:  Past Medical History  Diagnosis Date  . Poor dentition    Past Surgical History:  Past Surgical History  Procedure Laterality Date  . Cesarean section     HPI:  27 year old female admitted 02/28/13 due to swelling of the tongue and throat.  CT neck revealed extensive edema involving the uvula and larynx.   Assessment / Plan / Recommendation Clinical Impression  Pt completed oral care after setup.  Oral motor strength and function appear adequate, except for velar and uvular edema. Pt exhibited intermittent throat clear following thin liquids. No other overt s/s aspiration with puree or solid.  Pt reports swelling seems to be subsiding, and she has only discomfort when swallowing (vs pain experienced earlier). At this time, it appears pt would be safe for soft foods, thin liquids via small/individual bites and sips.  ST will follow for diet tolerance and continued improvement per pt report.  If swelling continues or becomes problematic, FEES may be beneficial, to observe soft tissues within pharynx.  Pt was encouraged to sit upright during po intake.  Basic anatomy of the oral, nasal, and pharyngeal cavities were reviewed with pt/fiance, with discussion of the impact of swelling on velar and epiglottic movement on safe swallow.      Aspiration Risk  Moderate    Diet Recommendation Regular;Thin liquid (pt to make choices for softer items as throat discomfort improves.)   Liquid Administration via: Cup;Straw Medication Administration: Whole meds with liquid Supervision: Patient able to self feed Compensations: Slow rate;Small sips/bites Postural Changes and/or Swallow Maneuvers: Seated upright 90 degrees    Other  Recommendations Oral Care Recommendations: Oral care BID    Follow Up Recommendations   (TBD)    Frequency and Duration min 1 x/week  1 week   Pertinent Vitals/Pain VSS, throat discomfort more than pain.    SLP Swallow Goals  see care plan   Swallow Study Prior Functional Status   Vegan, tolerating regular diet with thin liquids prior to admit.    General Date of Onset: 02/28/13 HPI: 27 year old female admitted 02/28/13 due to swelling of the tongue and throat.  CT neck revealed extensive edema involving the uvula and larynx. Type of Study: Bedside swallow evaluation Previous Swallow Assessment: n/a Diet Prior to this Study: NPO Temperature Spikes Noted: No Respiratory Status: Room air History of Recent Intubation: No Behavior/Cognition: Alert;Cooperative;Pleasant mood Oral Cavity - Dentition: Adequate natural dentition Self-Feeding Abilities: Able to feed self Patient Positioning: Upright in bed Baseline Vocal Quality: Clear Volitional Cough: Weak Volitional Swallow: Able to elicit    Oral/Motor/Sensory Function Labial ROM: Within Functional Limits Labial Symmetry: Within Functional Limits Labial Strength: Within Functional Limits Labial Sensation: Within Functional Limits Lingual ROM: Within Functional Limits Lingual Symmetry: Within Functional Limits Lingual Strength: Within Functional Limits Lingual Sensation: Within Functional Limits Facial ROM: Within Functional Limits Facial Symmetry: Within Functional Limits Facial Strength: Within Functional Limits Facial Sensation: Within Functional Limits Velum: Impaired right;Impaired left Mandible: Within Functional Limits   Ice Chips Ice chips: Not tested   Thin Liquid Thin Liquid: Impaired Presentation: Straw;Cup Pharyngeal  Phase Impairments: Throat Clearing - Immediate    Nectar Thick Nectar Thick Liquid: Not tested   Honey Thick Honey Thick Liquid: Not tested   Puree Puree: Within functional limits Presentation: Self Fed;Spoon  Solid   GO    Solid: Within  functional limits Other Comments: grimace with swallow - pt reports she is anticipating pain      [name] B. Joanna Natal Ms [facility], CCC-SLP [phone]  [name] 02/28/2013,5:03 PM

## 2013-02-28 NOTE — ED Provider Notes (Signed)
CSN: [id number]     Arrival date & time 02/28/13  2952 History   First MD Initiated Contact with Patient 02/28/13 1038     Chief Complaint  Patient presents with  . Shortness of Breath  . Oral Swelling   (Consider location/radiation/quality/duration/timing/severity/associated sxs/prior Treatment) HPI  27 year old female with multiple drugs allergies presents for evaluations of tongue swelling and shortness of breath. Patient reports she was seen in the ER 2 days ago for tooth pain and tongue swelling.  sts swelling was related to taking Goodie's powder.  She received dental block, and pain medication, no abx.  Sts sxs improves and she was sxs free yesterday.  This AM she woke up, noticed difficulty breathing, throat swelling, and subsequently rash on body.  Onset 4-5 hrs ago.  No specific treatment tried.  Sxs is improving without treatment, but not fully resolved.  Denies fever, chills, headache, cp, n/v/d, abd pain or numbness.  Denies any other environmental changes, no new pets, changes in soap or detergent.  No hx of asthma.    History reviewed. No pertinent past medical history. Past Surgical History  Procedure Laterality Date  . Cesarean section     No family history on file. History  Substance Use Topics  . Smoking status: Current Every Day Smoker  . Smokeless tobacco: Not on file  . Alcohol Use: Yes   OB History   Grav Para Term Preterm Abortions TAB SAB Ect Mult Living                 Review of Systems  All other systems reviewed and are negative.    Allergies  Amoxicillin; Aspirin; Naproxen; and Penicillins  Home Medications   Current Outpatient Rx  Name  Route  Sig  Dispense  Refill  . acetaminophen (TYLENOL) 500 MG tablet   Oral   Take 1,000 mg by mouth every 4 (four) hours as needed for moderate pain.         . diphenhydrAMINE (BENADRYL) 12.5 MG/5ML elixir   Oral   Take 25 mg by mouth 4 (four) times daily as needed for itching or allergies.           . diphenhydrAMINE (BENADRYL) 25 MG tablet   Oral   Take 25 mg by mouth every 6 (six) hours as needed for itching or allergies.          . Multiple Vitamins-Minerals (ALIVE WOMENS ENERGY) TABS   Oral   Take 1 tablet by mouth daily.         Marland Kitchen oxyCODONE-acetaminophen (PERCOCET) 5-325 MG per tablet   Oral   Take 1-2 tablets by mouth every 4 (four) hours as needed.   20 tablet   0   . tetrahydrozoline 0.05 % ophthalmic solution   Both Eyes   Place 2 drops into both eyes daily as needed (for dry eyes).          BP 112/50  Pulse 94  Temp(Src) 98.6 F (37 C) (Oral)  Resp 18  Wt 165 lb (74.844 kg)  SpO2 100%  LMP 10/28/2012 Physical Exam  Nursing note and vitals reviewed. Constitutional: She is oriented to person, place, and time. She appears well-developed and well-nourished. No distress.  Awake, alert, nontoxic appearance  HENT:  Head: Atraumatic.  Mouth/Throat: Oropharynx is clear and moist. No oropharyngeal exudate.  Throat: Uvula is edematous.  No tongue swelling or lip swelling.  NO trismus, no evidence of deep tissue infection.    Eyes: Conjunctivae are normal.  Right eye exhibits no discharge. Left eye exhibits no discharge.  Neck: Normal range of motion. Neck supple. No tracheal deviation present. No thyromegaly present.  Cardiovascular: Normal rate and regular rhythm.   Pulmonary/Chest: Effort normal. No respiratory distress. She has no wheezes. She has no rales. She exhibits no tenderness.  Abdominal: Soft. There is no tenderness. There is no rebound.  Musculoskeletal: She exhibits no edema and no tenderness.  ROM appears intact, no obvious focal weakness  Neurological: She is alert and oriented to person, place, and time.  Mental status and motor strength appears intact  Skin: No rash noted.  Psychiatric: She has a normal mood and affect.    ED Course  Procedures (including critical care time)  10:58 AM Patient here with throat swelling and trouble  breathing. She is currently in no acute respiratory distress. She is afebrile. On exam patient does have a edematous uvula but no evidence of deep tissue infection. Her lungs are clear. Plan to give IV fluid, Benadryl, Pepcid, Solu-Medrol, and we'll continue to monitor. Care discussed with attending.  11:01 AM My attending has seen and evaluate pt.  Plan to obtain soft tissue xray to r/o epiglottitis.  Treatment given.  Will have close monitoring.    1:05 PM CT soft tissue neck with extensive edema involving the uvula.  Large retropharyngeal effusion without evidence of abscess.  Finding could be due to pharyngitis or allergic reaction.  Pt currently receiving clindamycin IV.  Plan to have pt admitted for close monitoring.    1:26 PM I have consulted internal medicine-unassigned, spoke with resident who agrees to see pt in ER and will admit for close monitoring.    CRITICAL CARE Performed by: Fayrene Helper Total critical care time: 30 min Critical care time was exclusive of separately billable procedures and treating other patients. Critical care was necessary to treat or prevent imminent or life-threatening deterioration. Critical care was time spent personally by me on the following activities: development of treatment plan with patient and/or surrogate as well as nursing, discussions with consultants, evaluation of patient's response to treatment, examination of patient, obtaining history from patient or surrogate, ordering and performing treatments and interventions, ordering and review of laboratory studies, ordering and review of radiographic studies, pulse oximetry and re-evaluation of patient's condition.   Labs Review Labs Reviewed  CBC WITH DIFFERENTIAL - Abnormal; Notable for the following:    Hemoglobin 15.7 (*)    All other components within normal limits  COMPREHENSIVE METABOLIC PANEL - Abnormal; Notable for the following:    Potassium 3.4 (*)    Albumin 3.3 (*)    All other  components within normal limits   Imaging Review Ct Soft Tissue Neck W Contrast  02/28/2013   CLINICAL DATA:  Sore throat with Short of breath. Swollen uvula. Question allergic reaction to medication.  EXAM: CT NECK WITH CONTRAST  TECHNIQUE: Multidetector CT imaging of the neck was performed using the standard protocol following the bolus administration of intravenous contrast.  CONTRAST:  75mL OMNIPAQUE IOHEXOL 300 MG/ML  SOLN  COMPARISON:  None.  FINDINGS: There is enlargement and edema throughout the uvula. There is a large retropharyngeal effusion bilaterally. No rim enhancing abscess is present. There is some edema in the larynx and right piriform sinus which is effaced. The epiglottis is not significantly enlarged. The tongue is normal. Subglottic airway is normal.  Subcentimeter level 2 nodes bilaterally which appear reactive. No mass lesion is identified. Paranasal sinuses are clear.  IMPRESSION: Extensive edema involving  the uvula. Large retropharyngeal effusion without evidence of abscess. The effusion extends from C1 through C7. This could be due to pharyngitis and infection however could also be due to an allergic reaction. There is edema in the larynx especially on the right.  I discussed the findings by telephone with Dr.  Lia Hopping   Electronically Signed   By: Marlan Palau M.D.   On: 02/28/2013 12:59    EKG Interpretation   None       MDM   1. Throat swelling   2. Allergic reaction caused by a drug    BP 99/59  Pulse 80  Temp(Src) 98.6 F (37 C) (Oral)  Resp 18  Wt 165 lb (74.844 kg)  SpO2 100%  LMP 10/28/2012  I have reviewed nursing notes and vital signs. I personally reviewed the imaging tests through PACS system  I reviewed available ER/hospitalization records thought the EMR      Fayrene Helper, PA-C 02/28/13 1329

## 2013-03-01 LAB — BASIC METABOLIC PANEL
CO2: 22 mEq/L (ref 19–32)
Chloride: 109 mEq/L (ref 96–112)
Creatinine, Ser: 0.56 mg/dL (ref 0.50–1.10)
Glucose, Bld: 116 mg/dL — ABNORMAL HIGH (ref 70–99)

## 2013-03-01 MED ORDER — EPINEPHRINE 0.3 MG/0.3ML IJ SOAJ: 0.3000 mg | Freq: Once | INTRAMUSCULAR | Status: AC

## 2013-03-01 MED ORDER — OXYCODONE HCL 5 MG PO TABS: 5.0000 mg | ORAL_TABLET | Freq: Four times a day (QID) | ORAL | Status: DC | PRN

## 2013-03-01 MED ORDER — PREDNISONE 10 MG PO TABS: 5.0000 mg | ORAL_TABLET | Freq: Every day | ORAL | Status: DC

## 2013-03-01 NOTE — Progress Notes (Signed)
   CARE MANAGEMENT NOTE 03/01/2013  Patient:  [name], [name]   Account Number:  [id number]  Date Initiated:  03/01/2013  Documentation initiated by:  [facility]  Subjective/Objective Assessment:   adm: Tongue swelling, throat swelling     Action/Plan:   discharge planning   Anticipated DC Date:  03/01/2013   Anticipated DC Plan:  HOME/SELF CARE      DC Planning Services  CM consult  MATCH Program      Choice offered to / List presented to:             Status of service:  Completed, signed off Medicare Important Message given?   (If response is "NO", the following Medicare IM given date fields will be blank) Date Medicare IM given:   Date Additional Medicare IM given:    Discharge Disposition:  HOME/SELF CARE  Per UR Regulation:    If discussed at Long Length of Stay Meetings, dates discussed:    Comments:  03/01/13 17:30 CM met with pt in room and gave her MATCH letter and list of participating pharmacies.  CM also gave pt [facility] handout to pursue PCP and f/u needs.  CM also gave pt [facility]) handout to pursue insurance during this enrollment period under the Affordable Care Act.  Pt understands appt with navigator with [facility] will be free.  No other CM needs were communicated.  Freddy Jaksch, BSN, CM [phone].

## 2013-03-01 NOTE — Progress Notes (Signed)
Subjective: [name] is doing well this morning, no further lip or tongue swelling, no difficulty swallowing, no trouble breathing.    Objective: Vital signs in last 24 hours: Filed Vitals:   02/28/13 1500 02/28/13 1501 02/28/13 2124 03/01/13 0610  BP: 113/94 113/94 112/54 95/47  Pulse: 78 81 79 78  Temp:   98.5 F (36.9 C) 98.4 F (36.9 C)  TempSrc:   Oral Oral  Resp:  18 18 18   Weight:      SpO2: 100% 100% 99% 100%   Weight change:   Intake/Output Summary (Last 24 hours) at 03/01/13 1208 Last data filed at 03/01/13 4098  Gross per 24 hour  Intake   1480 ml  Output    200 ml  Net   1280 ml   PEX General: alert, cooperative, and in no apparent distress HEENT: NCAT, oropharynx non-erythematous and without exudates, edematous uvula but much improved from yesterday Neck: supple, no lymphadenopathy Lungs: clear to ascultation bilaterally, normal work of respiration, no wheezes, rales, ronchi Heart: regular rate and rhythm, no murmurs, gallops, or rubs Abdomen: soft, non-tender, non-distended, normal bowel sounds Extremities: 2+ DP/PT pulses bilaterally, no cyanosis, clubbing, or edema Neurologic: alert & oriented X3, cranial nerves II-XII intact, strength grossly intact, sensation intact to light touch  Lab Results: Basic Metabolic Panel:  Recent Labs Lab 02/28/13 1105 02/28/13 1342 03/01/13 0425  NA 140  --  139  K 3.4*  --  3.6  CL 104  --  109  CO2 26  --  22  GLUCOSE 82  --  116*  BUN 10  --  7  CREATININE 0.75  --  0.56  CALCIUM 8.7  --  8.1*  MG  --  1.7  --    Liver Function Tests:  Recent Labs Lab 02/28/13 1105  AST 24  ALT 19  ALKPHOS 75  BILITOT 0.3  PROT 6.5  ALBUMIN 3.3*   CBC:  Recent Labs Lab 02/28/13 1105  WBC 8.5  NEUTROABS 5.0  HGB 15.7*  HCT 46.0  MCV 93.7  PLT 359   Studies/Results: Ct Soft Tissue Neck W Contrast  02/28/2013   CLINICAL DATA:  Sore throat with Short of breath. Swollen uvula. Question allergic reaction  to medication.  EXAM: CT NECK WITH CONTRAST  TECHNIQUE: Multidetector CT imaging of the neck was performed using the standard protocol following the bolus administration of intravenous contrast.  CONTRAST:  75mL OMNIPAQUE IOHEXOL 300 MG/ML  SOLN  COMPARISON:  None.  FINDINGS: There is enlargement and edema throughout the uvula. There is a large retropharyngeal effusion bilaterally. No rim enhancing abscess is present. There is some edema in the larynx and right piriform sinus which is effaced. The epiglottis is not significantly enlarged. The tongue is normal. Subglottic airway is normal.  Subcentimeter level 2 nodes bilaterally which appear reactive. No mass lesion is identified. Paranasal sinuses are clear.  IMPRESSION: Extensive edema involving the uvula. Large retropharyngeal effusion without evidence of abscess. The effusion extends from C1 through C7. This could be due to pharyngitis and infection however could also be due to an allergic reaction. There is edema in the larynx especially on the right.  I discussed the findings by telephone with Dr.  Lia Hopping   Electronically Signed   By: Marlan Palau M.D.   On: 02/28/2013 12:59   Medications: I have reviewed the patient's current medications. Scheduled Meds: . enoxaparin (LOVENOX) injection  40 mg Subcutaneous Q24H  . famotidine (PEPCID) IV  20 mg Intravenous Q12H  . methylPREDNISolone (SOLU-MEDROL) injection  60 mg Intravenous Q8H   Continuous Infusions: . sodium chloride 125 mL/hr at 03/01/13 1610   Assessment/Plan: #Uvulitis- Very likely allergic in etiology, most likely due to acetaminophen.  Dexamethasone and benadryl in ED (no epi) improved symptoms; she has been receiving methylprednisolone q8h and Bendaryl q4h while inpatient.  Uvular edema much improved today.  Patient will need allergy testing as outpatient, and she should avoid acetaminophen and acetominophen-containing products until then; will also discharge with rx for Epipen that she  should carry with her at all times. She will also need to see a dentist soon to have her tooth repaired.  In the meantime, we will write rx for oxycodone 5 mg PO prn #15 so she will have an analgesic that does not contain acetaminophen or NSAIDs if pain recurs.  -transition to prednisone PO steroid taper at discharge -continue benadryl 25 mg PO prn   Dispo:  Anticipated discharge today.   The patient does not have a current PCP (No primary provider on file.) and does need an [facility] hospital follow-up appointment after discharge.   .Services Needed at time of discharge: Y = Yes, Blank = No PT:   OT:   RN:   Equipment:   Other:     LOS: 1 day   Rocco Serene, MD 03/01/2013, 12:08 PM

## 2013-03-01 NOTE — H&P (Signed)
Internal Medicine Attending Admission Note Date: 03/01/2013  Patient name: [name] Medical record number: [id number] Date of birth: [birth year] Age: 27 y.o. Gender: female  I saw and evaluated the patient. I reviewed the resident's note and I agree with the resident's findings and plan as documented in the resident's note.  Briefly, Ms. 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  As ASA and Tylenol are generally IgE mediated reactions it is unlikely a reaction to the ASA as this exposure took place days before the most recent episode, but the acetaminophen has been taken continuously, hence the reason for the concern for an acetaminophen IgE (Type I) reaction.  Acetaminophen was added to her allergy list and we spent a considerable amount of time discussing the possible risks to her if she were to take acetaminophen again.  This is problematic for her moving forward because of her reaction to ASA (and thus NSAIDs) as well.  We therefore recommend follow-up with an allergist as an outpatient to sort all of this out.  In the interim, we will give her oxycodone (#15-20) to have at home for pain until these reactions to analgesics are worked out.  On examination today her uvula was mildly edematous, but reportedly markedly improved from yesterday.  She is breathing and swallowing comfortably and I agree with discharge to home today with follow-up in the [facility] to establish Primary Care and refer to an allergist.  She will also be discharged home with a prescription for an EpiPen, a prednisone taper, and was reminded to not allow the dog in her bedroom.

## 2013-03-01 NOTE — Discharge Summary (Signed)
Name: [name] MRN: [id number] DOB: [birth year] 27 y.o. PCP: No primary provider on file.  Date of Admission: 02/28/2013 10:02 AM Date of Discharge: 03/01/2013 Attending Physician: Rocco Serene, MD  Discharge Diagnosis: Principal Problem:   Uvulitis Active Problems:   Allergic reaction caused by a drug  Discharge Medications:   Medication List    STOP taking these medications       acetaminophen 500 MG tablet  Commonly known as:  TYLENOL     oxyCODONE-acetaminophen 5-325 MG per tablet  Commonly known as:  PERCOCET      TAKE these medications       ALIVE WOMENS ENERGY Tabs  Take 1 tablet by mouth daily.     diphenhydrAMINE 25 MG tablet  Commonly known as:  BENADRYL  Take 25 mg by mouth every 6 (six) hours as needed for itching or allergies.     EPINEPHrine 0.3 mg/0.3 mL Soaj injection  Commonly known as:  EPIPEN  Inject 0.3 mLs (0.3 mg total) into the muscle once.     oxyCODONE 5 MG immediate release tablet  Commonly known as:  Oxy IR/ROXICODONE  Take 1 tablet (5 mg total) by mouth every 6 (six) hours as needed for severe pain.     predniSONE 10 MG tablet  Commonly known as:  DELTASONE  Take 0.5 tablets (5 mg total) by mouth daily with breakfast.     tetrahydrozoline 0.05 % ophthalmic solution  Place 2 drops into both eyes daily as needed (for dry eyes).        Disposition and follow-up:   Ms.[name] was discharged from [facility] in Stable condition.  At the hospital follow up visit please address:  1. Further lip or tongue swelling   2. Patient needs to be referred for allergy testing ASAP!  3. Tooth pain, patient needs to follow-up with dentist ASAP  4.  Labs / imaging needed at time of follow-up: none  5.  Pending labs/ test needing follow-up: none  Follow-up Appointments:   Discharge Instructions: Discharge Orders   Future Orders Complete By Expires   Call MD for:  difficulty breathing, headache or visual  disturbances  As directed    Call MD for:  severe uncontrolled pain  As directed    Diet - low sodium heart healthy  As directed    Increase activity slowly  As directed       Consultations:  none  Procedures Performed:  Ct Soft Tissue Neck W Contrast  02/28/2013   CLINICAL DATA:  Sore throat with Short of breath. Swollen uvula. Question allergic reaction to medication.  EXAM: CT NECK WITH CONTRAST  TECHNIQUE: Multidetector CT imaging of the neck was performed using the standard protocol following the bolus administration of intravenous contrast.  CONTRAST:  75mL OMNIPAQUE IOHEXOL 300 MG/ML  SOLN  COMPARISON:  None.  FINDINGS: There is enlargement and edema throughout the uvula. There is a large retropharyngeal effusion bilaterally. No rim enhancing abscess is present. There is some edema in the larynx and right piriform sinus which is effaced. The epiglottis is not significantly enlarged. The tongue is normal. Subglottic airway is normal.  Subcentimeter level 2 nodes bilaterally which appear reactive. No mass lesion is identified. Paranasal sinuses are clear.  IMPRESSION: Extensive edema involving the uvula. Large retropharyngeal effusion without evidence of abscess. The effusion extends from C1 through C7. This could be due to pharyngitis and infection however could also be due to an allergic reaction.  There is edema in the larynx especially on the right.  I discussed the findings by telephone with Dr.  Lia Hopping   Electronically Signed   By: Marlan Palau M.D.   On: 02/28/2013 12:59   Admission HPI:  [name] is a 27 year old woman without significant PMH who presents with swollen tongue, lips and sore throat.  Patient states she started having some swelling and hives on Sunday 11/16 which she attributed to dog being in the house due to cold weather and being on her bed. She took Benadryl and symptoms improved. On Wednesday, she developed severe tooth pain and applied Goody powder to affected area  which caused lip swelling soon after; she took Benadryl x 2 which improved the swelling. On Thursday, she woke up with swollen lips and tongue and tooth was still hurting so she came to ED. In the ED, she was given dexamethasone 10 mg injection including local injections to affected area as well as Norco and was sent home with a prescription for Percocet (no antibiotics). Tooth pain has since resolved. She was symptoms-free yesterday 11/21. However, this morning she woke up with severe lip and tongue swelling and was having difficulty breathing (though no trouble swallowing) so she immediately came to the ED. Of note, she did take Tylenol through Friday evening.  No history of asthma. She does have documented allergies to ASA, Naproxen, and Penicillins (all hives and swelling). She smokes 1-2 cigarettes/day. No new foods or soaps/detergents.  Denies difficulty swallowing, neck swelling, fever/chills, chest pain, SOB, nausea/vomiting.  In ED today, patient received dexamethasone 10 mg IV, benadryl 25 mg IV, pepcid 20 mg IV, clindamycin 600 mg IV, and NS 1 L bolus (no epinephrine). Her symptoms have since improved. CT showed extensive edema involving uvula, large retropharyngeal effusion (extends from C1-7) without evidence of abscess, could be due to pharyngitis or allergic reaction. There is also edema in larynx especially on right.    Hospital Course by problem list: 1. Uvulitis- Patient presented with hives/lip and tongue swelling for past week. She had awoken with swollen lips and tongue as well as difficulty breathing on day of admission.  Of note, patient had several new exposures in past week including her dog (inside now due to cold weather), ASA in Goodie Powder (only on Wednesday prior to admission), Tylenol in several medications (all of which she was taking for tooth pain), perfumes at new job.  Differential included allergic vs. infectious etiologies. Dexamethasone and benadryl in ED (no  epinephrine given) improved symptoms. Patient remained afebrile, VSS, no leucocytosis. On initial exam, no oropharyngeal erythema or exudates appreciated but significant uvular edema was present. CT neck showed extensive edema involving uvula, large retropharyngeal effusion (extends from C1-7) without evidence of abscess; also edema in larynx especially on right.  Centor score of 0 (1-2.5% probability of strep pharyngitis).  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  As ASA and Tylenol are generally IgE (Type 1)-mediated reactions, reaction to the ASA/Goodie Powder unlikely as this exposure took place days before most recent recent episode, but acetaminophen had been taken continuously through evening prior to admission.   Patient received Solumedrol 60 mg q8h, Benadryl 25 mg q4h, and Pepcid 20 mg q12h as well as NS at 125 cc/hr while inpatient.  SLP cleared patient for regular diet.  She will need allergy testing as outpatient, and she was instructed to avoid acetominophen and all acetominophen-containing products until then.  In  addition, acetaminophen was added to her allergy list.   On day of discharge, patient was feeling much better, had no further swelling, no trouble swallowing or breathing; on exam, uvular edema was significantly improved.  She was discharged with prescriptions for prednisone taper (starting at 60 mg daily, tapering by 10 mg every two days), and Epipen that she should carry with her at all times.  She will need to see a dentist soon given that she has a chipped right lower molar but in interim, we provided prescription for oxycodone 5 mg PO prn pain #15 so she will have an analgesic that does not contain acetaminophen or NSAIDs.  Message sent to [facility] front desk to arrange very close outpatient follow-up with referral to allergist at that time.      Discharge Vitals:   BP 95/47  Pulse 78  Temp(Src) 98.4 F (36.9 C) (Oral)  Resp  18  Wt 165 lb (74.844 kg)  SpO2 100%  LMP 10/28/2012  Discharge Labs:  Results for orders placed during the hospital encounter of 02/28/13 (from the past 24 hour(s))  MAGNESIUM     Status: None   Collection Time    02/28/13  1:42 PM      Result Value Range   Magnesium 1.7  1.5 - 2.5 mg/dL  CG4 I-STAT (LACTIC ACID)     Status: None   Collection Time    02/28/13  2:01 PM      Result Value Range   Lactic Acid, Venous 1.30  0.5 - 2.2 mmol/L  BASIC METABOLIC PANEL     Status: Abnormal   Collection Time    03/01/13  4:25 AM      Result Value Range   Sodium 139  135 - 145 mEq/L   Potassium 3.6  3.5 - 5.1 mEq/L   Chloride 109  96 - 112 mEq/L   CO2 22  19 - 32 mEq/L   Glucose, Bld 116 (*) 70 - 99 mg/dL   BUN 7  6 - 23 mg/dL   Creatinine, Ser 8.41  0.50 - 1.10 mg/dL   Calcium 8.1 (*) 8.4 - 10.5 mg/dL   GFR calc non Af Amer >90  >90 mL/min   GFR calc Af Amer >90  >90 mL/min    Signed: Rocco Serene, MD 03/01/2013, 1:37 PM   Time Spent on Discharge: 35 minutes Services Ordered on Discharge: none Equipment Ordered on Discharge: none

## 2013-03-02 NOTE — ED Provider Notes (Signed)
Medical screening examination/treatment/procedure(s) were performed by non-physician practitioner and as supervising physician I was immediately available for consultation/collaboration.  EKG Interpretation   None         Byrdie Miyazaki M Zowie Lundahl, MD 03/02/13 0753 

## 2013-03-10 ENCOUNTER — Encounter: Payer: Self-pay | Admitting: Internal Medicine

## 2013-03-19 ENCOUNTER — Encounter (HOSPITAL_COMMUNITY): Payer: Self-pay | Admitting: Emergency Medicine

## 2013-03-19 ENCOUNTER — Emergency Department (HOSPITAL_COMMUNITY)
Admission: EM | Admit: 2013-03-19 | Discharge: 2013-03-19 | Disposition: A | Discharge status: Home / Self Care | Payer: Self-pay | Attending: Emergency Medicine | Admitting: Emergency Medicine

## 2013-03-19 DIAGNOSIS — Z880 Allergy status to penicillin: Secondary | ICD-10-CM | POA: Insufficient documentation

## 2013-03-19 DIAGNOSIS — Z79899 Other long term (current) drug therapy: Secondary | ICD-10-CM

## 2013-03-19 DIAGNOSIS — Z760 Encounter for issue of repeat prescription: Secondary | ICD-10-CM | POA: Insufficient documentation

## 2013-03-19 DIAGNOSIS — Z8719 Personal history of other diseases of the digestive system: Secondary | ICD-10-CM | POA: Insufficient documentation

## 2013-03-19 DIAGNOSIS — F17200 Nicotine dependence, unspecified, uncomplicated: Secondary | ICD-10-CM | POA: Insufficient documentation

## 2013-03-19 NOTE — ED Notes (Signed)
Pt was admitted 11/22 for allergic reaction. Was given Rx's but pt did not get them filled. States she "was working". States has continued with "hives" since her discharge from the hospital. States today, woke with left eye lid swelling. Went to get Rx's filled but her "match" had expired.

## 2013-03-19 NOTE — Progress Notes (Signed)
03/19/13 1430 W. Sydney Hasten RN BSN [phone] ED CM received incoming call from T. Neva Seat PA-C in [place] regarding medication assistance. Pt presented to ED with allergic reaction withLeft eye swelling. Pt was discharge from [facility] 03/01/13 with prescriptions, patient was unable to afford prescriptions. Enrolled in [facility] letter was given. Reports taking prescriptions to [facility] pharmacy without picking up the meds. Pt tried to collect the meds today and was told the [facility] letter had expired. Pt then returned to ED wanting a new MATCH letter. In to see patient. Reiterated the MATCH guidelines to patient, MATCH need to be used within 7 days of receiving letter. Pt verbalizes understanding and is appreciative. Explained that we could reinstate her. Pt is in agreement with plan.  MATCH changed new  letter printed and given to patient. Explained that MATCH does not cover narcotics. Pt will also need assistance with Epipen it is not covered by Match. Epispen savings card faxed to [facility] pharmacy on [address]. Discussed the importance of having a PCP for health maintenance and follow up care. Pt has agreed to follow up with [facility].  No further ED CM needs identified

## 2013-03-19 NOTE — ED Provider Notes (Signed)
Medical screening examination/treatment/procedure(s) were performed by non-physician practitioner and as supervising physician I was immediately available for consultation/collaboration.  EKG Interpretation   None         Enid Skeens, MD 03/19/13 [phone]

## 2013-03-19 NOTE — ED Provider Notes (Signed)
CSN: [id number]     Arrival date & time 03/19/13  1314 History  This chart was scribed for non-physician practitioner, Marlon Pel, PA-C working with Enid Skeens, MD by Luisa Dago, ED scribe. This patient was seen in room TR04C/TR04C and the patient's care was started at 1:45 PM.      No chief complaint on file.   The history is provided by the patient. No language interpreter was used.   HPI Comments: [name] is a 27 y.o. female who presents to the Emergency Department complaining of left eye swelling that got worse this morning. She experienced pressure this morning before presenting to the ED but it has gone down. She complains of difficulty breathing last night but the symptom is not present anymore. She was admitted November 22 for an allergic reaction and she was given a prescriptions via the match program but never filled them. She tried to this morning but she was told that the prescriptions had expired and she needed to renew them. Pt denies taking any medication to reduce the eye swelling. Pt denies eye pain, and eye itching.\  Her prescriptions are for Percocet, prednisone, benadryl and epi pen. Pt has not seen allergist yet.   Past Medical History  Diagnosis Date  . Poor dentition    Past Surgical History  Procedure Laterality Date  . Cesarean section     No family history on file. History  Substance Use Topics  . Smoking status: Current Every Day Smoker  . Smokeless tobacco: Not on file  . Alcohol Use: Yes   OB History   Grav Para Term Preterm Abortions TAB SAB Ect Mult Living                 Review of Systems  HENT: Positive for facial swelling (left eye).   Eyes: Negative for pain and itching.  All other systems reviewed and are negative.    Allergies  Amoxicillin; Aspirin; Naproxen; Penicillins; and Tylenol  Home Medications   Current Outpatient Rx  Name  Route  Sig  Dispense  Refill  . diphenhydrAMINE (BENADRYL) 25 MG tablet   Oral   Take 25 mg by mouth every 6 (six) hours as needed for itching or allergies.          . Multiple Vitamins-Minerals (ALIVE WOMENS ENERGY) TABS   Oral   Take 1 tablet by mouth daily.         Marland Kitchen tetrahydrozoline 0.05 % ophthalmic solution   Both Eyes   Place 2 drops into both eyes daily as needed (for dry eyes).         Marland Kitchen EPINEPHrine (EPIPEN) 0.3 mg/0.3 mL SOAJ injection   Intramuscular   Inject 0.3 mLs (0.3 mg total) into the muscle once.   1 Device   1    BP 111/77  Pulse 116  Temp(Src) 98.1 F (36.7 C) (Oral)  Resp 18  SpO2 100%  LMP 03/12/2013 Physical Exam  Nursing note and vitals reviewed. Constitutional: She is oriented to person, place, and time. She appears well-developed and well-nourished. No distress.  HENT:  Head: Normocephalic and atraumatic.  Eyes: EOM are normal.  Painless periorbital swelling to the left eye without redness, induration, or discharge.  Neck: Neck supple. No tracheal deviation present.  Cardiovascular: Normal rate.   Pulmonary/Chest: Effort normal. No respiratory distress.  Musculoskeletal: Normal range of motion.  Neurological: She is alert and oriented to person, place, and time.  Skin: Skin is warm and dry.  Psychiatric: She has a normal mood and affect. Her behavior is normal.    ED Course  Procedures (including critical care time)  DIAGNOSTIC STUDIES: Oxygen Saturation is 100% on room air, normal by my interpretation.    COORDINATION OF CARE: 1:56 PM-Discussed treatment plan which includes speaking to case manager about renewing the prescriptions that she was originally given via match program with pt at bedside and pt agreed to plan.   Labs Review Labs Reviewed - No data to display Imaging Review No results found.  EKG Interpretation   None       MDM   1. Medication management    Case Management came and  Filled paperwork for patient to use the Match program to get prescriptions filled. Physical exam did not show  any airway or systemic involvement.   27 y.o.[place] Constable Limas's evaluation in the Emergency Department is complete. It has been determined that no acute conditions requiring further emergency intervention are present at this time. The patient/guardian have been advised of the diagnosis and plan. We have discussed signs and symptoms that warrant return to the ED, such as changes or worsening in symptoms.  Vital signs are stable at discharge. Filed Vitals:   03/19/13 1319  BP: 111/77  Pulse: 116  Temp: 98.1 F (36.7 C)  Resp: 18    Patient/guardian has voiced understanding and agreed to follow-up with the PCP or specialist.  I personally performed the services described in this documentation, which was scribed in my presence. The recorded information has been reviewed and is accurate.    Dorthula Matas, PA-C 03/19/13 1500

## 2013-05-30 ENCOUNTER — Encounter (HOSPITAL_COMMUNITY): Payer: Self-pay | Admitting: Emergency Medicine

## 2013-05-30 ENCOUNTER — Emergency Department (HOSPITAL_COMMUNITY)
Admission: EM | Admit: 2013-05-30 | Discharge: 2013-05-30 | Disposition: A | Discharge status: Home / Self Care | Payer: Self-pay | Attending: Emergency Medicine | Admitting: Emergency Medicine

## 2013-05-30 DIAGNOSIS — K089 Disorder of teeth and supporting structures, unspecified: Secondary | ICD-10-CM | POA: Insufficient documentation

## 2013-05-30 DIAGNOSIS — Z880 Allergy status to penicillin: Secondary | ICD-10-CM | POA: Insufficient documentation

## 2013-05-30 DIAGNOSIS — F17200 Nicotine dependence, unspecified, uncomplicated: Secondary | ICD-10-CM | POA: Insufficient documentation

## 2013-05-30 DIAGNOSIS — K0889 Other specified disorders of teeth and supporting structures: Secondary | ICD-10-CM

## 2013-05-30 MED ORDER — EPINEPHRINE 0.3 MG/0.3ML IJ SOAJ: 0.3000 mg | Freq: Once | INTRAMUSCULAR | Status: AC | PRN

## 2013-05-30 MED ORDER — OXYCODONE HCL 5 MG PO TABS: 5.0000 mg | ORAL_TABLET | ORAL | Status: DC | PRN

## 2013-05-30 MED ORDER — CLINDAMYCIN HCL 150 MG PO CAPS: 300.0000 mg | ORAL_CAPSULE | Freq: Three times a day (TID) | ORAL | Status: DC

## 2013-05-30 NOTE — ED Notes (Signed)
Pt in c/o toothache over the last few days, patient thinks tooth is broken, unsure of fever at home

## 2013-05-30 NOTE — ED Notes (Signed)
Pt informed by Albin Fellingarla, RN that there are no available epi discount coupons available.

## 2013-05-30 NOTE — ED Provider Notes (Signed)
CSN: [id number]     Arrival date & time 05/30/13  1639 History  This chart was scribed for non-physician practitioner, Fuller Canada, working with Hurman Horn, MD by Smiley Houseman, ED Scribe. This patient was seen in room TR09C/TR09C and the patient's care was started at 8:09 PM.  Chief Complaint  Patient presents with  . Dental Pain   The history is provided by the patient. No language interpreter was used.   HPI Comments: [name] is a 28 y.o. female who presents to the Emergency Department complaining of severe worsening dental pain that started over the past two days.  Pt states she broke her right lower back tooth and thinks she has an exposed nerve.  Pt states the pain is extremely severe, especially when air gets in the tooth.  Pt denies pain under her tongue, but states she has pain surrounding her lower right jaw.  She states she has placed clove on her tooth to alleviate pain without relief.  Pt states she called the dentist and she won't be able to have an appointment until 2 weeks.  She reports she is allergic to naproxen, penicillins, tylenol, and amoxicillin.    Past Medical History  Diagnosis Date  . Poor dentition    Past Surgical History  Procedure Laterality Date  . Cesarean section     History reviewed. No pertinent family history. History  Substance Use Topics  . Smoking status: Current Every Day Smoker  . Smokeless tobacco: Not on file  . Alcohol Use: Yes   OB History   Grav Para Term Preterm Abortions TAB SAB Ect Mult Living                 Review of Systems  Constitutional: Negative for fever and chills.  HENT: Positive for dental problem.   Gastrointestinal: Negative for nausea, vomiting and diarrhea.  Skin: Negative for color change and rash.  Psychiatric/Behavioral: Negative for behavioral problems and confusion.      Allergies  Amoxicillin; Aspirin; Naproxen; Penicillins; and Tylenol  Home Medications   Current Outpatient Rx  Name   Route  Sig  Dispense  Refill  . diphenhydrAMINE (BENADRYL) 25 MG tablet   Oral   Take 25 mg by mouth every 6 (six) hours as needed for itching or allergies.          Marland Kitchen EPINEPHrine (EPIPEN) 0.3 mg/0.3 mL SOAJ injection   Intramuscular   Inject 0.3 mLs (0.3 mg total) into the muscle once.   1 Device   1   . Multiple Vitamins-Minerals (ALIVE WOMENS ENERGY) TABS   Oral   Take 1 tablet by mouth daily.         Marland Kitchen tetrahydrozoline 0.05 % ophthalmic solution   Both Eyes   Place 2 drops into both eyes daily as needed (for dry eyes).          Triage Vitals: BP 116/55  Pulse 71  Temp(Src) 98.2 F (36.8 C) (Oral)  Resp 16  Ht 5\' 2"  (1.575 m)  Wt 186 lb 9 oz (84.624 kg)  BMI 34.11 kg/m2  SpO2 100% Physical Exam  ED Course  Dental Date/Time: 05/30/2013 8:22 PM Performed by: Roxy Horseman Authorized by: Roxy Horseman Consent: Verbal consent obtained. written consent not obtained. Risks and benefits: risks, benefits and alternatives were discussed Consent given by: patient Patient understanding: patient states understanding of the procedure being performed Patient consent: the patient's understanding of the procedure matches consent given Procedure consent: procedure consent matches  procedure scheduled Relevant documents: relevant documents present and verified Test results: test results available and properly labeled Site marked: the operative site was marked Imaging studies: imaging studies available Required items: required blood products, implants, devices, and special equipment available Patient identity confirmed: verbally with patient Time out: Immediately prior to procedure a "time out" was called to verify the correct patient, procedure, equipment, support staff and site/side marked as required. Preparation: Patient was prepped and draped in the usual sterile fashion. Local anesthesia used: yes Local anesthetic: lidocaine 2% with epinephrine Patient sedated:  no Patient tolerance: Patient tolerated the procedure well with no immediate complications.   (including critical care time) DIAGNOSTIC STUDIES: Oxygen Saturation is 100% on RA, normal by my interpretation.    COORDINATION OF CARE: 8:09 PM-Patient informed of current plan of treatment and evaluation and agrees with plan.    Labs Review Labs Reviewed - No data to display Imaging Review No results found.  EKG Interpretation   None       MDM   Final diagnoses:  Pain, dental    Patient with toothache.  No gross abscess.  Exam unconcerning for Ludwig's angina or spread of infection.  Will treat with penicillin and pain medicine.  Urged patient to follow-up with dentist.      Roxy Horsemanobert Marg Macmaster, PA-C 05/30/13 2023

## 2013-05-30 NOTE — ED Notes (Signed)
PT ambulated with baseline gait; VSS; A&Ox3; no signs of distress; respirations even and unlabored; skin warm and dry; no questions upon discharge.  

## 2013-05-30 NOTE — Discharge Instructions (Signed)

## 2013-05-31 NOTE — ED Provider Notes (Signed)
Medical screening examination/treatment/procedure(s) were performed by non-physician practitioner and as supervising physician I was immediately available for consultation/collaboration.   Hurman HornJohn M Roxann Vierra, MD 05/31/13 [phone]

## 2015-09-17 IMAGING — CT CT NECK W/ CM
4 of 5 series · 15 of 33 positions shown, 17 images · IV contrast (CONTRAST)
Comparison: None.

CLINICAL DATA: Sore throat with Short of breath. Swollen uvula.
Question allergic reaction to medication.

EXAM:
CT NECK WITH CONTRAST
TECHNIQUE: Multidetector CT imaging of the neck was performed using the
standard protocol following the bolus administration of intravenous
contrast.
CONTRAST:  75mL OMNIPAQUE IOHEXOL 300 MG/ML  SOLN

[Series 2: soft tissue · axial · 0.42mm/px · z∈[+52,+204]mm · 4 of 128 slices shown]
[im 26/128  soft-tissue]
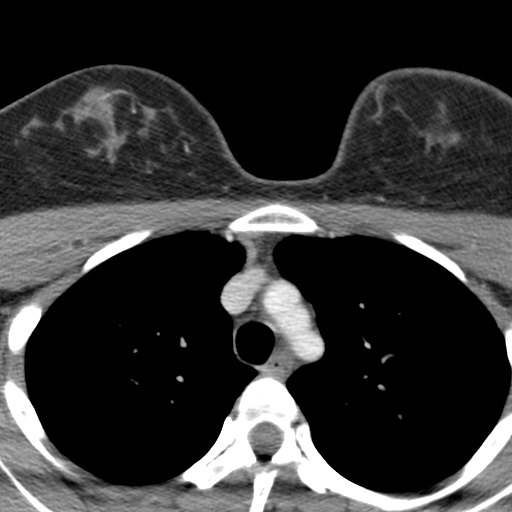
[im 51/128  soft-tissue]
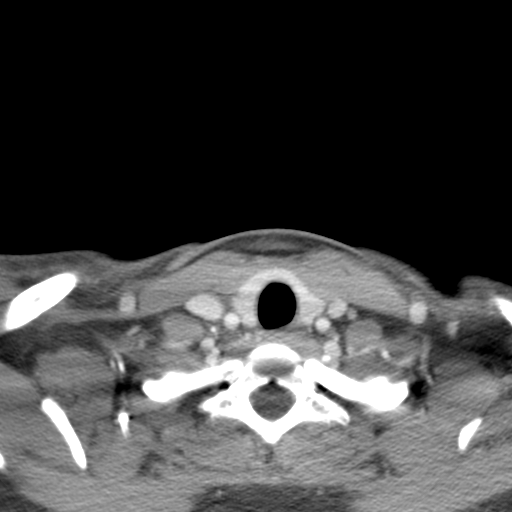
[im 77/128  soft-tissue]
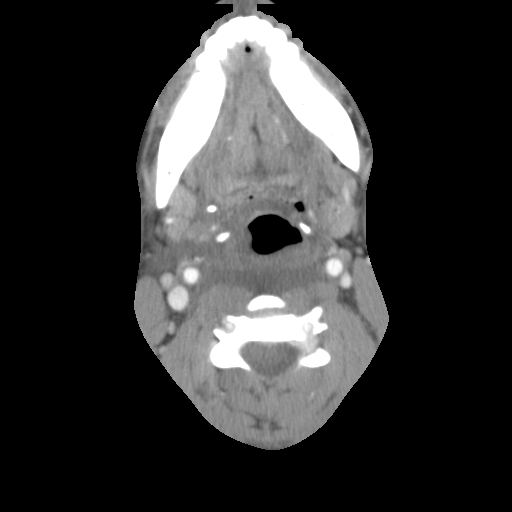
[im 102/128  soft-tissue]
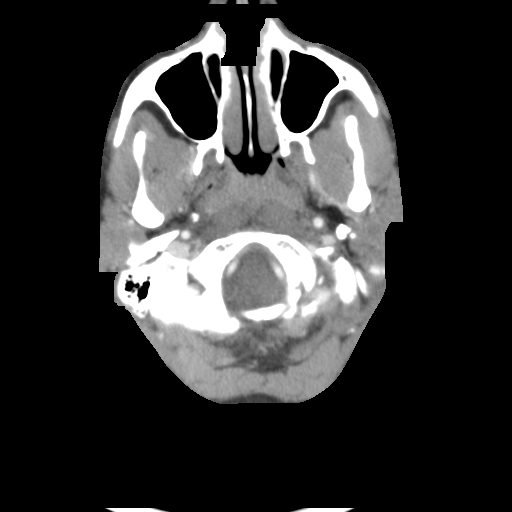

[sagittal · sagittal · 0.50mm/px · 5 of 57 slices shown, 6 images]
[im 19/57  bone]
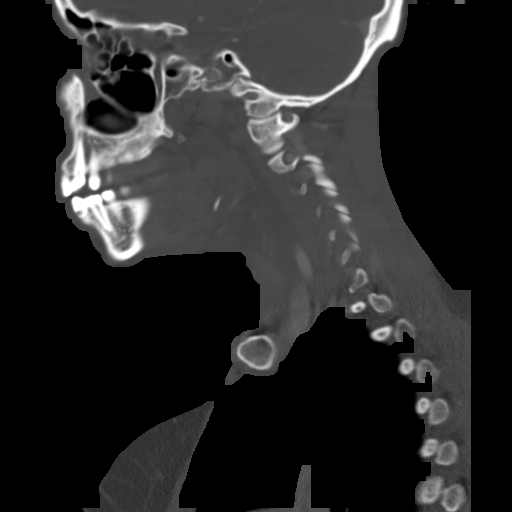
[im 24/57  bone]
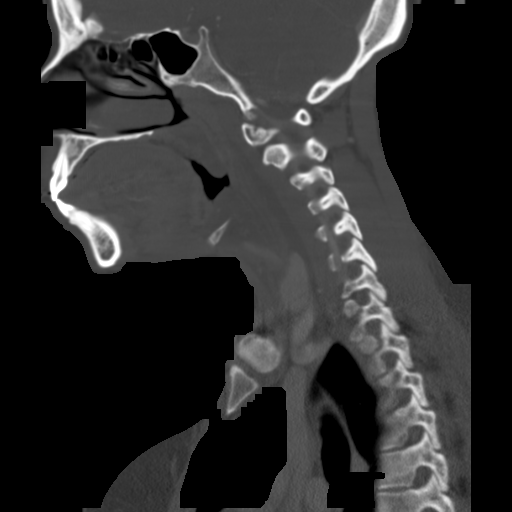
[im 29/57  soft-tissue]
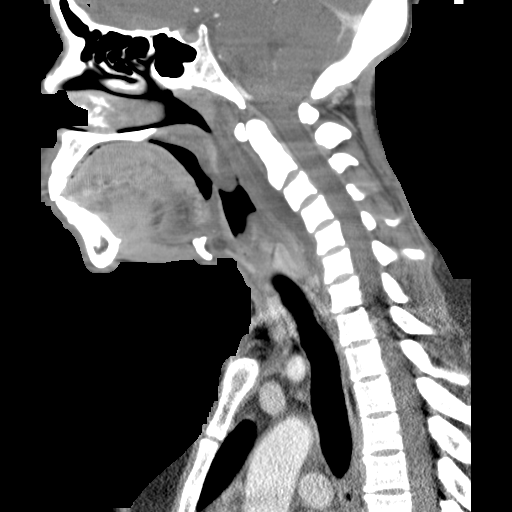
[im 29/57  bone]
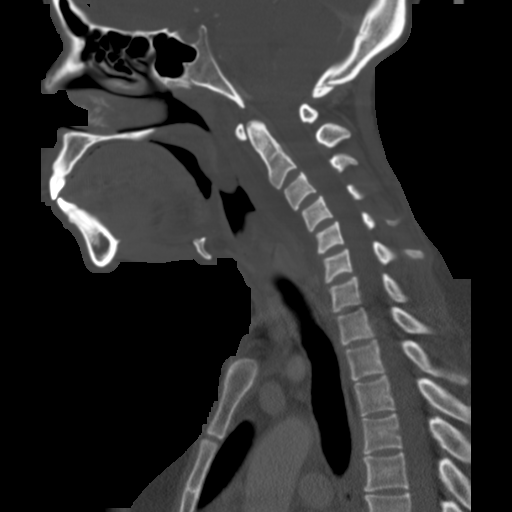
[im 33/57  bone]
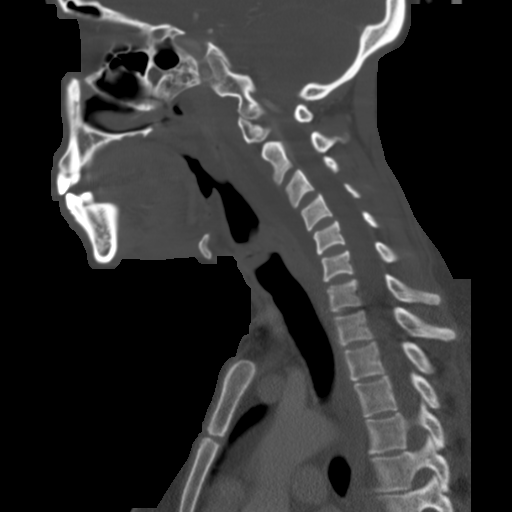
[im 38/57  bone]
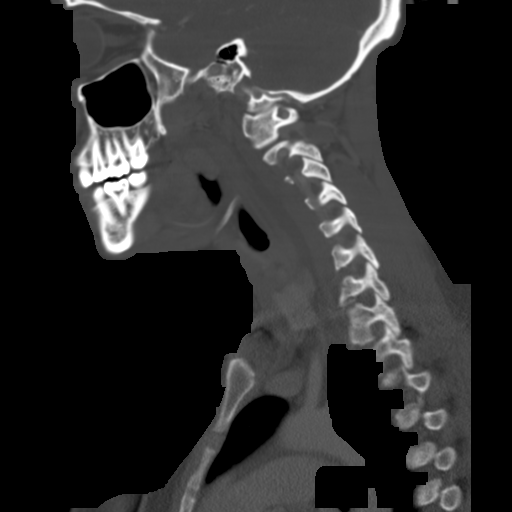

[coronal · coronal · 0.49mm/px · 3 of 74 slices shown]
[im 16/74  bone]
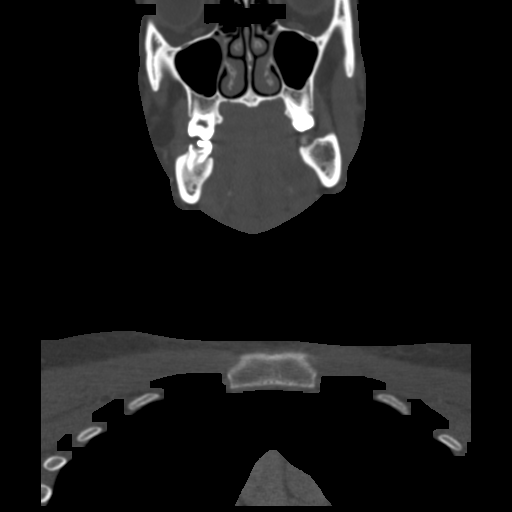
[im 30/74  bone]
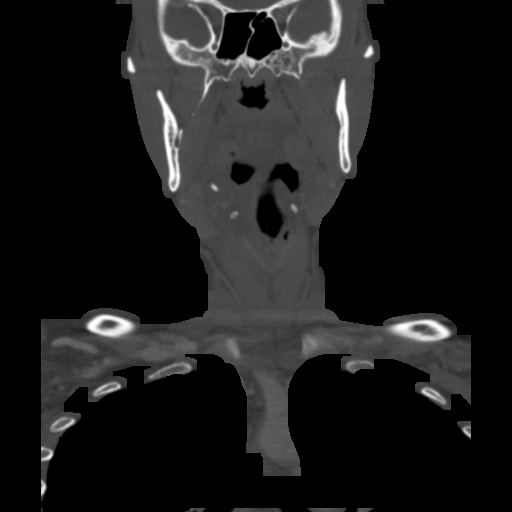
[im 44/74  bone]
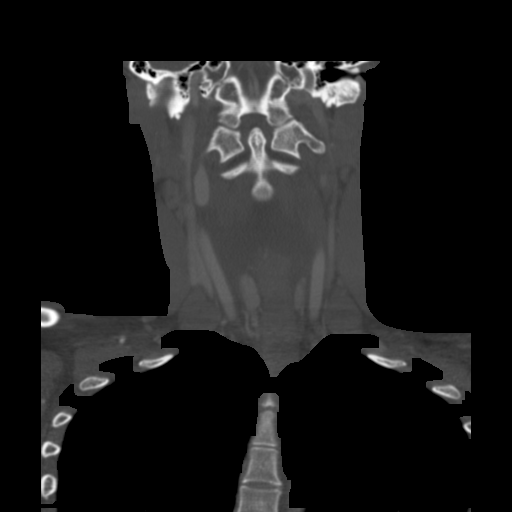

[orthgonal · axial · 0.41mm/px · z∈[+53,+162]mm · 3 of 116 slices shown, 4 images]
[im 29/116  soft-tissue]
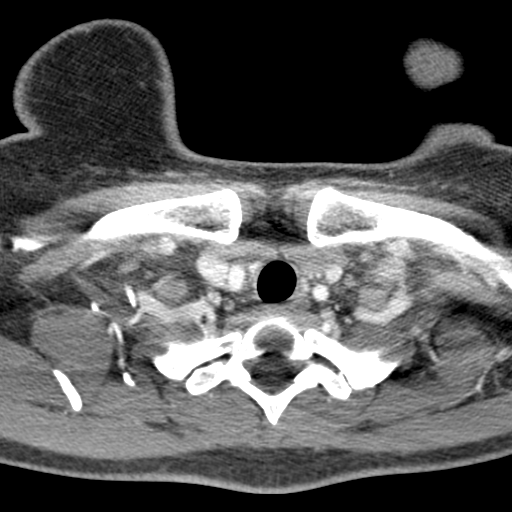
[im 29/116  bone]
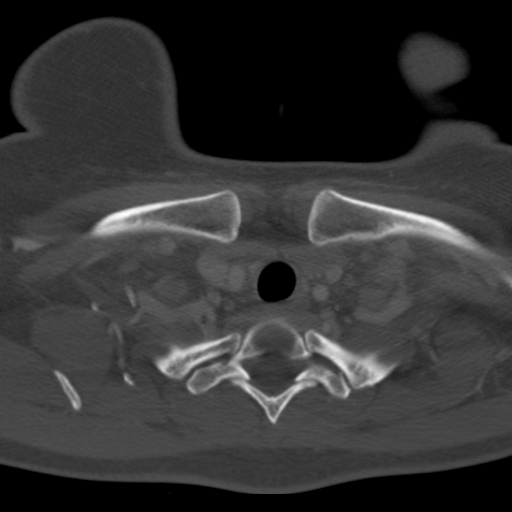
[im 58/116  bone]
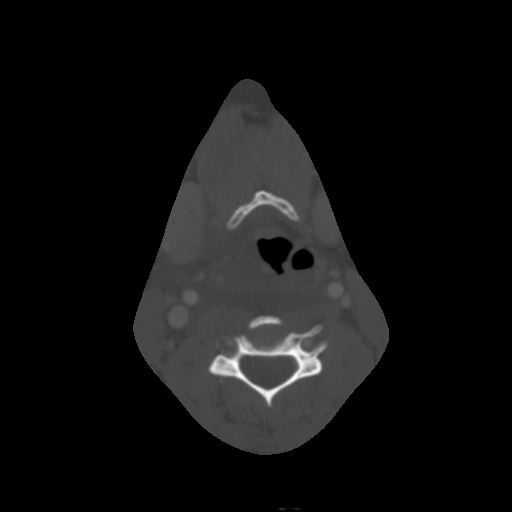
[im 87/116  bone]
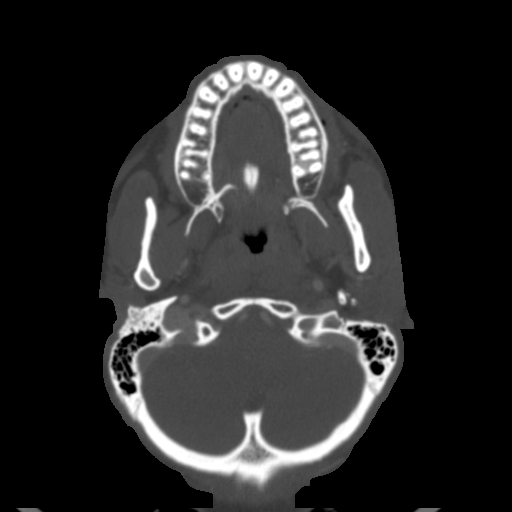

[15 of 33 positions shown; findings below may reference images not displayed]

FINDINGS: There is enlargement and edema throughout the uvula. There is a
large retropharyngeal effusion bilaterally. No rim enhancing abscess
is present. There is some edema in the larynx and right piriform
sinus which is effaced. The epiglottis is not significantly
enlarged. The tongue is normal. Subglottic airway is normal.

Subcentimeter level 2 nodes bilaterally which appear reactive. No
mass lesion is identified. Paranasal sinuses are clear.
IMPRESSION: Extensive edema involving the uvula. Large retropharyngeal effusion
without evidence of abscess. The effusion extends from C1 through
C7. This could be due to pharyngitis and infection however could
also be due to an allergic reaction. There is edema in the larynx
especially on the right.

I discussed the findings by telephone with Dr.  Goda

## 2016-04-11 ENCOUNTER — Encounter (HOSPITAL_COMMUNITY): Payer: Self-pay | Admitting: Emergency Medicine

## 2016-04-11 ENCOUNTER — Emergency Department (HOSPITAL_COMMUNITY)
Admission: EM | Admit: 2016-04-11 | Discharge: 2016-04-11 | Disposition: A | Discharge status: Home / Self Care | Payer: Self-pay | Attending: Emergency Medicine | Admitting: Emergency Medicine

## 2016-04-11 DIAGNOSIS — K047 Periapical abscess without sinus: Secondary | ICD-10-CM | POA: Insufficient documentation

## 2016-04-11 DIAGNOSIS — F17200 Nicotine dependence, unspecified, uncomplicated: Secondary | ICD-10-CM | POA: Insufficient documentation

## 2016-04-11 MED ORDER — CLINDAMYCIN HCL 150 MG PO CAPS: 300.0000 mg | ORAL_CAPSULE | Freq: Four times a day (QID) | ORAL | 0 refills | Status: DC

## 2016-04-11 MED ORDER — TRAMADOL HCL 50 MG PO TABS: 50.0000 mg | ORAL_TABLET | Freq: Four times a day (QID) | ORAL | 0 refills | Status: DC | PRN

## 2016-04-11 NOTE — Discharge Instructions (Signed)
Clindamycin for infection as prescribed until all gone. Tramadol for infection. Follow up with a dentist.

## 2016-04-11 NOTE — ED Triage Notes (Signed)
Rt lower tooth  Pain sin ce last week now swelling and painful

## 2016-04-11 NOTE — ED Notes (Signed)
Video visit coupon number and handout given to patient on discharge

## 2016-04-11 NOTE — ED Provider Notes (Signed)
[facility]-EMERGENCY DEPT Provider Note   CSN: [id number] Arrival date & time: 04/11/16  1331  By signing my name below, I, Majel HomerPeyton Lee, attest that this documentation has been prepared under the direction and in the presence of Evangelene Vora, PA-C . Electronically Signed: Majel HomerPeyton Lee, Scribe. 04/11/2016. 2:50 PM.  History   Chief Complaint Chief Complaint  Patient presents with  . Dental Pain   The history is provided by the patient. No language interpreter was used.   HPI Comments: [name] is a 31 y.o. female who presents to the Emergency Department complaining of gradually worsening, right lower dental pain that began ~3 weeks ago. Pt reports she recently "broke" her right lower tooth while eating last week and states associated right cheek swelling. She notes hx of a dental abscess and states her current pain feels similar. She reports she took Tylenol for her pain with no relief.   Past Medical History:  Diagnosis Date  . Poor dentition     Patient Active Problem List   Diagnosis Date Noted  . Uvulitis 02/28/2013  . 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 CESAREAN SECTION      OB History    No data available     Home Medications    Prior to Admission medications   Medication Sig Start Date End Date Taking? Authorizing Provider  clindamycin (CLEOCIN) 150 MG capsule Take 2 capsules (300 mg total) by mouth 3 (three) times daily. May dispense as 150mg  capsules 05/30/13   Roxy Horsemanobert Browning, PA-C  diphenhydrAMINE (BENADRYL) 25 MG tablet Take 25 mg by mouth every 6 (six) hours as needed for itching or allergies.     Historical Provider, MD  EPINEPHrine (EPIPEN 2-PAK) 0.3 mg/0.3 mL SOAJ injection Inject 0.3 mLs (0.3 mg total) into the muscle once as needed (for severe allergic reaction). CAll 911 immediately if you have to use this medicine 05/30/13   Roxy Horsemanobert Browning, PA-C  EPINEPHrine (EPIPEN) 0.3 mg/0.3 mL SOAJ  injection Inject 0.3 mLs (0.3 mg total) into the muscle once. 03/01/13   Leanora CoverMorgan E Rogers, MD  Multiple Vitamins-Minerals (ALIVE WOMENS ENERGY) TABS Take 1 tablet by mouth daily.    Historical Provider, MD  oxyCODONE (ROXICODONE) 5 MG immediate release tablet Take 1 tablet (5 mg total) by mouth every 4 (four) hours as needed for severe pain. 05/30/13   Roxy Horsemanobert Browning, PA-C  tetrahydrozoline 0.05 % ophthalmic solution Place 2 drops into both eyes daily as needed (for dry eyes).    Historical Provider, MD    Family History No family history on file.  Social History Social History  Substance Use Topics  . Smoking status: Current Every Day Smoker  . Smokeless tobacco: Never Used  . Alcohol use Yes     Allergies   Amoxicillin; Aspirin; Naproxen; Penicillins; and Tylenol [acetaminophen]   Review of Systems Review of Systems  Constitutional: Negative for fever.  HENT: Positive for dental problem and facial swelling (right cheek).    Physical Exam Updated Vital Signs BP (!) 121/54   Pulse 79   Temp 98.9 F (37.2 C) (Oral)   Resp 16   SpO2 99%   Physical Exam  Constitutional: She is oriented to person, place, and time. She appears well-developed and well-nourished.  HENT:  Head: Normocephalic.  Right lower facial swelling.  right lower first molar with large cavity. There is surrounding gum swelling and dental absent present. No trismus. No swelling  in the tongue.  Eyes: EOM are normal.  Neck: Normal range of motion.  Pulmonary/Chest: Effort normal.  Abdominal: She exhibits no distension.  Musculoskeletal: Normal range of motion.  Neurological: She is alert and oriented to person, place, and time.  Psychiatric: She has a normal mood and affect.  Nursing note and vitals reviewed.  ED Treatments / Results  Labs (all labs ordered are listed, but only abnormal results are displayed) Labs Reviewed - No data to display  EKG  EKG Interpretation None       Radiology No  results found.  Procedures Procedures (including critical care time)  Medications Ordered in ED Medications - No data to display  DIAGNOSTIC STUDIES:  Oxygen Saturation is 99% on RA, normal by my interpretation.    COORDINATION OF CARE:  2:49 PM Discussed treatment plan with pt at bedside and pt agreed to plan.  Initial Impression / Assessment and Plan / ED Course  I have reviewed the triage vital signs and the nursing notes.  Pertinent labs & imaging results that were available during my care of the patient were reviewed by me and considered in my medical decision making (see chart for details).  Clinical Course     Patient emergency department with a dental abscess. No evidence of Ludwig's angina at this time. She is afebrile, nontoxic appearing. She has allergy to penicillin, will treat with clindamycin. She is also allergic to NSAIDs and Tylenol. Will give prescription for 15 tramadol's. Will refer to a dentist. Return precautions discussed.  Vitals:   04/11/16 1419  BP: (!) 121/54  Pulse: 79  Resp: 16  Temp: 98.9 F (37.2 C)  TempSrc: Oral  SpO2: 99%    I personally performed the services described in this documentation, which was scribed in my presence. The recorded information has been reviewed and is accurate.   Final Clinical Impressions(s) / ED Diagnoses   Final diagnoses:  Dental abscess    New Prescriptions New Prescriptions   No medications on file     Jaynie Crumble, PA-C 04/11/16 1457    Margarita Grizzle, MD 04/11/16 2049

## 2019-12-23 ENCOUNTER — Ambulatory Visit (HOSPITAL_COMMUNITY)
Admission: EM | Admit: 2019-12-23 | Discharge: 2019-12-23 | Disposition: A | Discharge status: Home / Self Care | Payer: Medicaid Other | Attending: Family Medicine | Admitting: Family Medicine

## 2019-12-23 ENCOUNTER — Other Ambulatory Visit: Payer: Self-pay

## 2019-12-23 ENCOUNTER — Encounter (HOSPITAL_COMMUNITY): Payer: Self-pay

## 2019-12-23 DIAGNOSIS — J069 Acute upper respiratory infection, unspecified: Secondary | ICD-10-CM | POA: Insufficient documentation

## 2019-12-23 DIAGNOSIS — U071 COVID-19: Secondary | ICD-10-CM | POA: Diagnosis not present

## 2019-12-23 DIAGNOSIS — F17210 Nicotine dependence, cigarettes, uncomplicated: Secondary | ICD-10-CM | POA: Insufficient documentation

## 2019-12-23 DIAGNOSIS — R05 Cough: Secondary | ICD-10-CM | POA: Insufficient documentation

## 2019-12-23 LAB — SARS CORONAVIRUS 2 (TAT 6-24 HRS): SARS Coronavirus 2: POSITIVE — AB

## 2019-12-23 NOTE — ED Triage Notes (Signed)
Pt c/o non productive cough and fatiguex4 days.

## 2019-12-23 NOTE — Discharge Instructions (Addendum)
You have been tested for COVID-19 today. °If your test returns positive, you will receive a phone call from [place] regarding your results. °Negative test results are not called. °Both positive and negative results area always visible on MyChart. °If you do not have a MyChart account, sign up instructions are provided in your discharge papers. °Please do not hesitate to contact us should you have questions or concerns. ° °

## 2019-12-23 NOTE — ED Provider Notes (Signed)
[facility]ARE [facility]ENTER   [id number] 12/23/19 Arrival Time: 0902  ASSESSMENT & PLAN:  1. Viral URI with cough      COVID-19 testing sent. See letter/work note on file for self-isolation guidelines. OTC symptom care as needed.   Follow-up Information    [facility].   Specialty: Urgent Care Why: As needed. Contact information: [address] [place] [address] [phone]              Reviewed expectations re: course of current medical issues. Questions answered. Outlined signs and symptoms indicating need for more acute intervention. Understanding verbalized. After Visit Summary given.   SUBJECTIVE: History from: patient. [name] is a 34 y.o. female who requests COVID-19 testing. Known COVID-19 contact: none. Recent travel: none. Reports: non-prod cough and fatigue for 3-4 days. Denies: difficulty breathing and fever. Normal PO intake without n/v/d.    OBJECTIVE:  Vitals:   12/23/19 1011  BP: (!) 105/46  Pulse: 83  Resp: 16  Temp: 98.5 F (36.9 C)  TempSrc: Oral  SpO2: 100%  Weight: 81.6 kg  Height: 5\' 1"  (1.549 m)    General appearance: alert; no distress Eyes: PERRLA; EOMI; conjunctiva normal HENT: [place]; AT; nasal congestion Neck: supple  Lungs: speaks full sentences without difficulty; unlabored Extremities: no edema Skin: warm and dry Neurologic: normal gait Psychological: alert and cooperative; normal mood and affect  Labs:  Labs Reviewed  SARS CORONAVIRUS 2 (TAT 6-24 HRS)     Allergies  Allergen Reactions  . Amoxicillin Hives and Swelling  . Aspirin Hives and Swelling  . Naproxen Swelling    Tongue swelling   . Penicillins Hives and Swelling  . Tylenol [Acetaminophen]     Throat swelling as of 02/2013 will send to allergist to confirm    Past Medical History:  Diagnosis Date  . Poor dentition    Social History   Socioeconomic History  . Marital status: Single    Spouse name:  Not on file  . Number of children: Not on file  . Years of education: Not on file  . Highest education level: Not on file  Occupational History  . Not on file  Tobacco Use  . Smoking status: Current Every Day Smoker    Packs/day: 0.24    Types: Cigarettes  . Smokeless tobacco: Never Used  Substance and Sexual Activity  . Alcohol use: Yes    Comment: occ  . Drug use: No  . Sexual activity: Not on file  Other Topics Concern  . Not on file  Social History Narrative  . Not on file   Social Determinants of Health   Financial Resource Strain:   . Difficulty of Paying Living Expenses: Not on file  Food Insecurity:   . Worried About 03/2013 in the Last Year: Not on file  . Ran Out of Food in the Last Year: Not on file  Transportation Needs:   . Lack of Transportation (Medical): Not on file  . Lack of Transportation (Non-Medical): Not on file  Physical Activity:   . Days of Exercise per Week: Not on file  . Minutes of Exercise per Session: Not on file  Stress:   . Feeling of Stress : Not on file  Social Connections:   . Frequency of Communication with Friends and Family: Not on file  . Frequency of Social Gatherings with Friends and Family: Not on file  . Attends Religious Services: Not on file  .  Active Member of Clubs or Organizations: Not on file  . Attends Banker Meetings: Not on file  . Marital Status: Not on file  Intimate Partner Violence:   . Fear of Current or Ex-Partner: Not on file  . Emotionally Abused: Not on file  . Physically Abused: Not on file  . Sexually Abused: Not on file   No family history on file. Past Surgical History:  Procedure Laterality Date  . CESAREAN SECTION       Mardella Layman, MD 12/23/19 1113

## 2019-12-24 ENCOUNTER — Telehealth (HOSPITAL_COMMUNITY): Payer: Self-pay | Admitting: Nurse Practitioner

## 2019-12-24 DIAGNOSIS — U071 COVID-19: Secondary | ICD-10-CM

## 2019-12-24 NOTE — Telephone Encounter (Signed)
Called to Discuss with patient about Covid symptoms and the use of regeneron, a monoclonal antibody infusion for those with mild to moderate Covid symptoms and at a high risk of hospitalization.   °  °Pt is qualified for this infusion at the [facility] infusion center due to co-morbid conditions and/or a member of an at-risk group.   °  °Unable to reach pt. Left message to return call. Sent text message.  ° °Monae Topping, DNP, AGNP-C °[phone] (Infusion Center Hotline) ° °

## 2019-12-25 ENCOUNTER — Telehealth: Payer: Self-pay | Admitting: Oncology

## 2019-12-25 NOTE — Telephone Encounter (Signed)
I connected by phone with  [name] to discuss the potential use of an new treatment for mild to moderate COVID-19 viral infection in non-hospitalized patients.   This patient is a age/sex that meets the FDA criteria for Emergency Use Authorization of casirivimab\imdevimab.  Has a (+) direct SARS-CoV-2 viral test result 1. Has mild or moderate COVID-19  2. Is ? 35 years of age and weighs ? 40 kg 3. Is NOT hospitalized due to COVID-19 4. Is NOT requiring oxygen therapy or requiring an increase in baseline oxygen flow rate due to COVID-19 5. Is within 10 days of symptom onset 6. Has at least one of the high risk factor(s) for progression to severe COVID-19 and/or hospitalization as defined in EUA. ? Specific high risk criteria :  Past Medical History:  Diagnosis Date  . Poor dentition      HIGH RISK- SVI- Obesity    Symptom onset  12/20/19   I have spoken and communicated the following to the patient or parent/caregiver:   1. FDA has authorized the emergency use of casirivimab\imdevimab for the treatment of mild to moderate COVID-19 in adults and pediatric patients with positive results of direct SARS-CoV-2 viral testing who are 86 years of age and older weighing at least 40 kg, and who are at high risk for progressing to severe COVID-19 and/or hospitalization.   2. The significant known and potential risks and benefits of casirivimab\imdevimab, and the extent to which such potential risks and benefits are unknown.   3. Information on available alternative treatments and the risks and benefits of those alternatives, including clinical trials.   4. Patients treated with casirivimab\imdevimab should continue to self-isolate and use infection control measures (e.g., wear mask, isolate, social distance, avoid sharing personal items, clean and disinfect "high touch" surfaces, and frequent handwashing) according to CDC guidelines.    5. The patient or parent/caregiver has the option to accept  or refuse casirivimab\imdevimab .   She would like to review information and get back with [place]. Information sent via text for her to review.   Mignon Pine, AGNP-C [phone] (Infusion Center Hotline)

## 2019-12-31 ENCOUNTER — Other Ambulatory Visit: Payer: Self-pay

## 2019-12-31 ENCOUNTER — Emergency Department (HOSPITAL_COMMUNITY)
Admission: EM | Admit: 2019-12-31 | Discharge: 2019-12-31 | Disposition: A | Discharge status: Home / Self Care | Payer: Medicaid Other | Attending: Emergency Medicine | Admitting: Emergency Medicine

## 2019-12-31 ENCOUNTER — Encounter (HOSPITAL_COMMUNITY): Payer: Self-pay | Admitting: Emergency Medicine

## 2019-12-31 DIAGNOSIS — K0889 Other specified disorders of teeth and supporting structures: Secondary | ICD-10-CM | POA: Diagnosis present

## 2019-12-31 DIAGNOSIS — F17210 Nicotine dependence, cigarettes, uncomplicated: Secondary | ICD-10-CM | POA: Diagnosis not present

## 2019-12-31 DIAGNOSIS — K047 Periapical abscess without sinus: Secondary | ICD-10-CM | POA: Insufficient documentation

## 2019-12-31 MED ORDER — CLINDAMYCIN HCL 150 MG PO CAPS: 450.0000 mg | ORAL_CAPSULE | Freq: Three times a day (TID) | ORAL | 0 refills | Status: AC

## 2019-12-31 NOTE — ED Provider Notes (Signed)
[facility] EMERGENCY DEPARTMENT Provider Note   CSN: [id number] Arrival date & time: 12/31/19  0535     History Chief Complaint  Patient presents with  . Dental Pain [name]    COVID+ 12/23/19    [name] is a 34 y.o. female who was diagnosed with COVID on 12/23/2019, presenting to the ED with a chief complaint of right lower dental pain.  For the past 3 days has been having sharp, aching pain in her right lower molar that is radiating to her right ear.  She has taken over-the-counter pain medications including ibuprofen and Tylenol with only minimal improvement in her symptoms.  She is concerned that she may have an infection.  She last saw her dentist about a year ago and was told that she needed to have the tooth removed at some point.  As far as her Covid infection, she denies any shortness of breath, chest pain or fever. Denies trismus, drooling, neck pain, drainage from ear.  HPI     Past Medical History:  Diagnosis Date  . Poor dentition     Patient Active Problem List   Diagnosis Date Noted  . Uvulitis 02/28/2013  . 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 CESAREAN SECTION       OB History   No obstetric history on file.     No family history on file.  Social History   Tobacco Use  . Smoking status: Current Every Day Smoker    Packs/day: 0.24    Types: Cigarettes  . Smokeless tobacco: Never Used  Substance Use Topics  . Alcohol use: Yes    Comment: occ  . Drug use: No    Home Medications Prior to Admission medications   Medication Sig Start Date End Date Taking? Authorizing Provider  clindamycin (CLEOCIN) 150 MG capsule Take 3 capsules (450 mg total) by mouth 3 (three) times daily for 7 days. 12/31/19 01/07/20  Zyan Mirkin, PA-C  diphenhydrAMINE (BENADRYL) 25 MG tablet Take 25 mg by mouth every 6 (six) hours as needed for itching or allergies.     [provider]  EPINEPHrine (EPIPEN 2-PAK) 0.3 mg/0.3 mL SOAJ injection Inject 0.3 mLs (0.3 mg total) into the muscle once as needed (for severe allergic reaction). CAll 911 immediately if you have to use this medicine 05/30/13   Roxy Horseman, PA-C  EPINEPHrine (EPIPEN) 0.3 mg/0.3 mL SOAJ injection Inject 0.3 mLs (0.3 mg total) into the muscle once. 03/01/13   Leanora Cover, MD    Allergies    Amoxicillin, Aspirin, Naproxen, Penicillins, and Tylenol [acetaminophen]  Review of Systems   Review of Systems  Constitutional: Negative for fever.  HENT: Positive for dental problem and ear pain.   Respiratory: Negative for cough and shortness of breath.   Cardiovascular: Negative for chest pain.  Musculoskeletal: Negative for neck pain and neck stiffness.    Physical Exam Updated Vital Signs BP (!) 105/59 (BP Location: Right Arm)   Pulse 66   Temp 98.3 F (36.8 C) (Oral)   Resp 17   Ht 5\' 1"  (1.549 m)   Wt 91 kg   LMP 12/15/2019   SpO2 100%   BMI 37.91 kg/m   Physical Exam Vitals and nursing note reviewed.  Constitutional:      General: She is not in acute distress.    Appearance: She is well-developed. She is not diaphoretic.  Comments: Speaking in complete sentences without difficulty.  No signs of respiratory distress.  HENT:     Head: Normocephalic and atraumatic.     Right Ear: A middle ear effusion is present. No mastoid tenderness.     Left Ear: A middle ear effusion is present. No mastoid tenderness.     Mouth/Throat:     Dentition: Abnormal dentition. No dental abscesses.      Comments: Several missing teeth noted with overall poor dentition.  There is a chipped tooth in the area without gross dental abscess or site of drainage. No facial, neck or cheek swelling noted. No pooling of secretions or trismus.  Normal voice noted with no difficulty swallowing or breathing.  No submandibular erythema, edema or crepitus noted. Eyes:     General: No scleral icterus.     Conjunctiva/sclera: Conjunctivae normal.  Cardiovascular:     Rate and Rhythm: Normal rate and regular rhythm.  Pulmonary:     Effort: Pulmonary effort is normal. No respiratory distress.     Breath sounds: Normal breath sounds.  Musculoskeletal:     Cervical back: Normal range of motion.  Skin:    Findings: No rash.  Neurological:     Mental Status: She is alert.     ED Results / Procedures / Treatments   Labs (all labs ordered are listed, but only abnormal results are displayed) Labs Reviewed - No data to display  EKG None  Radiology No results found.  Procedures Procedures (including critical care time)  Medications Ordered in ED Medications - No data to display  ED Course  I have reviewed the triage vital signs and the nursing notes.  Pertinent labs & imaging results that were available during my care of the patient were reviewed by me and considered in my medical decision making (see chart for details).    MDM Rules/Calculators/A&P                          ZURIA FOSDICK was evaluated in Emergency Department on 12/31/19 for the symptoms described in the history of present illness. He/she was evaluated in the context of the global COVID-19 pandemic, which necessitated consideration that the patient might be at risk for infection with the SARS-CoV-2 virus that causes COVID-19. Institutional protocols and algorithms that pertain to the evaluation of patients at risk for COVID-19 are in a state of rapid change based on information released by regulatory bodies including the CDC and federal and state organizations. These policies and algorithms were followed during the patient's care in the ED.  Patient with dentalgia. On exam, there is no evidence of a drainable abscess. No trismus, glossal elevation, unilateral tonsillar swelling.  No signs of otitis media or otitis externa.  No evidence of retropharyngeal or peritonsillar abscess or Ludwig angina. Will treat with   clindamycin. Pt instructed to follow-up with dentist as soon as possible. Resource guide provided with AVS.   Patient is hemodynamically stable, in NAD, and able to ambulate in the ED. Evaluation does not show pathology that would require ongoing emergent intervention or inpatient treatment. I explained the diagnosis to the patient. Pain has been managed and has no complaints prior to discharge. Patient is comfortable with above plan and is stable for discharge at this time. All questions were answered prior to disposition. Strict return precautions for returning to the ED were discussed. Encouraged follow up with PCP.   An After Visit Summary was printed and  given to the patient.   Portions of this note were generated with Scientist, clinical (histocompatibility and immunogenetics). Dictation errors may occur despite best attempts at proofreading.  Final Clinical Impression(s) / ED Diagnoses Final diagnoses:  Dental infection    Rx / DC Orders ED Discharge Orders         Ordered    clindamycin (CLEOCIN) 150 MG capsule  3 times daily        12/31/19 1038           Dietrich Pates, PA-C 12/31/19 1108    Tilden Fossa, MD 12/31/19 1443

## 2019-12-31 NOTE — ED Notes (Signed)
Patient verbalizes understanding of discharge instructions. Opportunity for questioning and answers were provided. Pt discharged from ED ambulatory.  

## 2019-12-31 NOTE — ED Triage Notes (Signed)
Patient reports right lower molar pain and right ear ache onset this week unrelieved by OTC pain medications , tested positive for COVID19 last 12/23/2019.

## 2019-12-31 NOTE — Discharge Instructions (Signed)
Take the antibiotics as directed. Complete the entire course of antibiotics regardless of symptom improvement without worsening or recurrence of your infection Follow-up with your dentist regarding this infection. You can also follow-up with Covid care clinic listed below. Return to the ER if you start to experience worsening swelling, pain, shortness of breath, chest pain or neck stiffness.
